# Patient Record
Sex: Female | Born: 1970 | Race: White | Hispanic: No | Marital: Married | Smoking: Never smoker
Health system: Southern US, Community
[De-identification: ages and names within clinical notes are randomized; demographics above are authoritative.]

## PROBLEM LIST (undated history)

## (undated) DIAGNOSIS — F329 Major depressive disorder, single episode, unspecified: Secondary | ICD-10-CM

## (undated) DIAGNOSIS — E042 Nontoxic multinodular goiter: Secondary | ICD-10-CM

## (undated) DIAGNOSIS — F419 Anxiety disorder, unspecified: Secondary | ICD-10-CM

## (undated) DIAGNOSIS — F32A Depression, unspecified: Secondary | ICD-10-CM

## (undated) DIAGNOSIS — A159 Respiratory tuberculosis unspecified: Secondary | ICD-10-CM

## (undated) HISTORY — PX: WISDOM TOOTH EXTRACTION: SHX21

## (undated) HISTORY — PX: INDUCED ABORTION: SHX677

---

## 1999-05-29 ENCOUNTER — Other Ambulatory Visit: Admission: RE | Admit: 1999-05-29 | Discharge: 1999-05-29 | Payer: Self-pay | Admitting: Obstetrics and Gynecology

## 1999-05-30 ENCOUNTER — Encounter (INDEPENDENT_AMBULATORY_CARE_PROVIDER_SITE_OTHER): Payer: Self-pay

## 1999-05-30 ENCOUNTER — Ambulatory Visit (HOSPITAL_COMMUNITY): Admission: RE | Admit: 1999-05-30 | Discharge: 1999-05-30 | Payer: Self-pay | Admitting: Obstetrics and Gynecology

## 2007-03-11 ENCOUNTER — Encounter: Admission: RE | Admit: 2007-03-11 | Discharge: 2007-03-11 | Payer: Self-pay | Admitting: Obstetrics and Gynecology

## 2008-05-11 ENCOUNTER — Encounter: Admission: RE | Admit: 2008-05-11 | Discharge: 2008-05-11 | Payer: Self-pay | Admitting: Family Medicine

## 2008-05-31 ENCOUNTER — Encounter: Admission: RE | Admit: 2008-05-31 | Discharge: 2008-05-31 | Payer: Self-pay | Admitting: Orthopedic Surgery

## 2010-06-14 NOTE — Op Note (Signed)
[facility]  Patient:    Rhonda Castillo, Rhonda Castillo                    MRN: [id number] Proc. Date: 05/30/99 Adm. Date:  [id number] Disc. Date: [id number] Attending:  Maxie Better                           Operative Report  PREOPERATIVE DIAGNOSIS:       Undesired pregnancy.  Intrauterine gestation at 7-3/7 weeks.  POSTOPERATIVE DIAGNOSIS:      Undesired pregnancy.  Intrauterine gestation at 7-3/7 weeks.  OPERATION:                    Suction dilatation and evacuation.  SURGEON:                      Sheronette A. Cherly Hensen, M.D.  ASSISTANT:  ANESTHESIA:                   MAC and paracervical block.  ESTIMATED BLOOD LOSS:  INDICATIONS:                  This is a 40 year old, gravida 1, para 0, female t 7-3/[redacted] weeks gestation with an undesired pregnancy who now presents for elective  termination.  Risks and benefits of the procedure had been explained.  At length, the consent was signed.  The patient was transferred to the operating room for er procedure.  DESCRIPTION OF PROCEDURE:     Under adequate monitored anesthesia, the patient as placed in the dorsal lithotomy position.  She was sterilely prepped and draped n the usual fashion.  Examination under anesthesia had revealed uterus that was about 7 to 8 weeks size, no adnexal masses could be appreciated.  The patients bladder was catheterized for a small amount of urine.  A bivalve speculum was placed in the vagina.  20 cc total of 1% Nesocaine was injected paracervically at 3 and 9 oclock.  The anterior lip of the cervix was grasped with a single tooth tenaculum. The cervix was then serially dilated up to #31 [facility] dilator and a #8 curved suction cannula was introduced into the uterine cavity.  The uterine cavity was  then suctioned of products of conception.  The suction cannula was removed. The uterine cavity was then curetted and resuctioned.  These procedures were then performed  until all tissue was felt to have been removed from the uterine cavity at which time all instruments were then removed from the vagina.  Specimen labled products of conception was sent to pathology.  Estimated blood loss was minimal. Complications were none.  The patient tolerated the procedure well and was transferred to the recovery room in stable condition.  Maternal blood type was pending and RhoGAM will be given as indicated. DD:  05/30/99 TD:  06/01/99 Job: [id number]

## 2010-07-16 ENCOUNTER — Other Ambulatory Visit: Payer: Self-pay | Admitting: Obstetrics and Gynecology

## 2010-07-16 DIAGNOSIS — Z1231 Encounter for screening mammogram for malignant neoplasm of breast: Secondary | ICD-10-CM

## 2010-07-24 ENCOUNTER — Ambulatory Visit
Admission: RE | Admit: 2010-07-24 | Discharge: 2010-07-24 | Disposition: A | Discharge status: Home / Self Care | Payer: BC Managed Care – PPO | Source: Ambulatory Visit | Attending: Obstetrics and Gynecology | Admitting: Obstetrics and Gynecology

## 2010-07-24 DIAGNOSIS — Z1231 Encounter for screening mammogram for malignant neoplasm of breast: Secondary | ICD-10-CM

## 2011-07-25 ENCOUNTER — Other Ambulatory Visit: Payer: Self-pay | Admitting: Obstetrics and Gynecology

## 2011-07-25 DIAGNOSIS — Z1231 Encounter for screening mammogram for malignant neoplasm of breast: Secondary | ICD-10-CM

## 2011-08-26 ENCOUNTER — Ambulatory Visit
Admission: RE | Admit: 2011-08-26 | Discharge: 2011-08-26 | Disposition: A | Discharge status: Home / Self Care | Payer: BC Managed Care – PPO | Source: Ambulatory Visit | Attending: Obstetrics and Gynecology | Admitting: Obstetrics and Gynecology

## 2011-08-26 DIAGNOSIS — Z1231 Encounter for screening mammogram for malignant neoplasm of breast: Secondary | ICD-10-CM

## 2011-08-29 ENCOUNTER — Other Ambulatory Visit: Payer: Self-pay | Admitting: Family Medicine

## 2011-08-29 DIAGNOSIS — R7989 Other specified abnormal findings of blood chemistry: Secondary | ICD-10-CM

## 2011-09-03 ENCOUNTER — Ambulatory Visit
Admission: RE | Admit: 2011-09-03 | Discharge: 2011-09-03 | Disposition: A | Discharge status: Home / Self Care | Payer: BC Managed Care – PPO | Source: Ambulatory Visit | Attending: Family Medicine | Admitting: Family Medicine

## 2011-09-03 DIAGNOSIS — R7989 Other specified abnormal findings of blood chemistry: Secondary | ICD-10-CM

## 2011-09-05 ENCOUNTER — Other Ambulatory Visit (HOSPITAL_COMMUNITY): Payer: Self-pay | Admitting: Family Medicine

## 2011-09-05 DIAGNOSIS — E0590 Thyrotoxicosis, unspecified without thyrotoxic crisis or storm: Secondary | ICD-10-CM

## 2011-09-10 ENCOUNTER — Ambulatory Visit (HOSPITAL_COMMUNITY): Payer: BC Managed Care – PPO

## 2011-09-11 ENCOUNTER — Other Ambulatory Visit (HOSPITAL_COMMUNITY): Payer: BC Managed Care – PPO

## 2011-09-15 ENCOUNTER — Encounter (HOSPITAL_COMMUNITY)
Admission: RE | Admit: 2011-09-15 | Discharge: 2011-09-15 | Disposition: A | Discharge status: Home / Self Care | Payer: BC Managed Care – PPO | Source: Ambulatory Visit | Attending: Family Medicine | Admitting: Family Medicine

## 2011-09-15 DIAGNOSIS — E0590 Thyrotoxicosis, unspecified without thyrotoxic crisis or storm: Secondary | ICD-10-CM | POA: Insufficient documentation

## 2011-09-16 ENCOUNTER — Encounter (HOSPITAL_COMMUNITY)
Admission: RE | Admit: 2011-09-16 | Discharge: 2011-09-16 | Disposition: A | Discharge status: Home / Self Care | Payer: BC Managed Care – PPO | Source: Ambulatory Visit | Attending: Family Medicine | Admitting: Family Medicine

## 2011-09-16 MED ORDER — SODIUM PERTECHNETATE TC 99M INJECTION
10.4000 | Freq: Once | INTRAVENOUS | Status: AC | PRN
  Administered 2011-09-16: 10.4 via INTRAVENOUS

## 2011-09-16 MED ORDER — SODIUM IODIDE I 131 CAPSULE: 15.5000 | Freq: Once | INTRAVENOUS | Status: AC | PRN

## 2011-09-24 ENCOUNTER — Other Ambulatory Visit: Payer: Self-pay | Admitting: Family Medicine

## 2011-09-24 DIAGNOSIS — E041 Nontoxic single thyroid nodule: Secondary | ICD-10-CM

## 2011-09-25 ENCOUNTER — Other Ambulatory Visit (HOSPITAL_COMMUNITY)
Admission: RE | Admit: 2011-09-25 | Discharge: 2011-09-25 | Disposition: A | Discharge status: Home / Self Care | Payer: BC Managed Care – PPO | Source: Ambulatory Visit | Attending: Interventional Radiology | Admitting: Interventional Radiology

## 2011-09-25 ENCOUNTER — Ambulatory Visit
Admission: RE | Admit: 2011-09-25 | Discharge: 2011-09-25 | Disposition: A | Discharge status: Home / Self Care | Payer: BC Managed Care – PPO | Source: Ambulatory Visit | Attending: Family Medicine | Admitting: Family Medicine

## 2011-09-25 DIAGNOSIS — E041 Nontoxic single thyroid nodule: Secondary | ICD-10-CM

## 2011-09-25 DIAGNOSIS — E049 Nontoxic goiter, unspecified: Secondary | ICD-10-CM | POA: Insufficient documentation

## 2012-07-13 ENCOUNTER — Other Ambulatory Visit: Payer: Self-pay

## 2012-07-13 DIAGNOSIS — Z1231 Encounter for screening mammogram for malignant neoplasm of breast: Secondary | ICD-10-CM

## 2012-08-26 ENCOUNTER — Ambulatory Visit
Admission: RE | Admit: 2012-08-26 | Discharge: 2012-08-26 | Disposition: A | Discharge status: Home / Self Care | Payer: BC Managed Care – PPO | Source: Ambulatory Visit

## 2012-08-26 DIAGNOSIS — Z1231 Encounter for screening mammogram for malignant neoplasm of breast: Secondary | ICD-10-CM

## 2013-08-29 ENCOUNTER — Other Ambulatory Visit: Payer: Self-pay

## 2013-08-29 DIAGNOSIS — Z1231 Encounter for screening mammogram for malignant neoplasm of breast: Secondary | ICD-10-CM

## 2013-09-01 ENCOUNTER — Encounter (INDEPENDENT_AMBULATORY_CARE_PROVIDER_SITE_OTHER): Payer: Self-pay

## 2013-09-01 ENCOUNTER — Ambulatory Visit
Admission: RE | Admit: 2013-09-01 | Discharge: 2013-09-01 | Disposition: A | Discharge status: Home / Self Care | Payer: BC Managed Care – PPO | Source: Ambulatory Visit

## 2013-09-01 DIAGNOSIS — Z1231 Encounter for screening mammogram for malignant neoplasm of breast: Secondary | ICD-10-CM

## 2013-10-14 ENCOUNTER — Other Ambulatory Visit (INDEPENDENT_AMBULATORY_CARE_PROVIDER_SITE_OTHER): Payer: Self-pay

## 2013-10-14 ENCOUNTER — Ambulatory Visit (INDEPENDENT_AMBULATORY_CARE_PROVIDER_SITE_OTHER): Payer: BC Managed Care – PPO | Admitting: General Surgery

## 2013-10-20 ENCOUNTER — Encounter: Payer: BC Managed Care – PPO | Attending: General Surgery | Admitting: Dietician

## 2013-10-20 VITALS — Ht 62.0 in | Wt 247.8 lb

## 2013-10-20 DIAGNOSIS — Z6841 Body Mass Index (BMI) 40.0 and over, adult: Secondary | ICD-10-CM | POA: Diagnosis not present

## 2013-10-20 DIAGNOSIS — Z01818 Encounter for other preprocedural examination: Secondary | ICD-10-CM | POA: Insufficient documentation

## 2013-10-20 DIAGNOSIS — Z713 Dietary counseling and surveillance: Secondary | ICD-10-CM | POA: Diagnosis present

## 2013-10-20 DIAGNOSIS — E669 Obesity, unspecified: Secondary | ICD-10-CM

## 2013-10-20 NOTE — Progress Notes (Signed)
  Pre-Op Assessment Visit:  Pre-Operative Gastric sleeve Surgery  Medical Nutrition Therapy:  Appt start time: 315   End time:  400.  Patient was seen on 10/20/2013 for Pre-Operative Gastric sleeve Nutrition Assessment. Assessment and letter of approval faxed to [facility] Bariatric Surgery Program coordinator on 10/20/2013.   Preferred Learning Style:   No preference indicated   Learning Readiness:   Ready  Handouts given during visit include:  Pre-Op Goals Bariatric Surgery Protein Shakes  Teaching Method Utilized:  Visual Auditory  Barriers to learning/adherence to lifestyle change: none  Demonstrated degree of understanding via:  Teach Back   Patient to call the [facility] to enroll in Pre-Op and Post-Op Nutrition Education when surgery date is scheduled.

## 2013-10-27 ENCOUNTER — Ambulatory Visit (HOSPITAL_COMMUNITY): Payer: BC Managed Care – PPO

## 2013-11-10 ENCOUNTER — Ambulatory Visit (HOSPITAL_COMMUNITY)
Admission: RE | Admit: 2013-11-10 | Discharge: 2013-11-10 | Disposition: A | Discharge status: Home / Self Care | Payer: BC Managed Care – PPO | Source: Ambulatory Visit | Attending: General Surgery | Admitting: General Surgery

## 2013-11-10 ENCOUNTER — Other Ambulatory Visit: Payer: Self-pay

## 2013-11-10 DIAGNOSIS — Z6841 Body Mass Index (BMI) 40.0 and over, adult: Secondary | ICD-10-CM | POA: Diagnosis not present

## 2013-11-10 DIAGNOSIS — Z01811 Encounter for preprocedural respiratory examination: Secondary | ICD-10-CM | POA: Diagnosis not present

## 2013-11-10 LAB — CBC WITH DIFFERENTIAL/PLATELET
BASOS ABS: 0 10*3/uL (ref 0.0–0.1)
BASOS PCT: 0 % (ref 0–1)
EOS ABS: 0.1 10*3/uL (ref 0.0–0.7)
EOS PCT: 2 % (ref 0–5)
HEMATOCRIT: 41.1 % (ref 36.0–46.0)
Hemoglobin: 13.8 g/dL (ref 12.0–15.0)
LYMPHS PCT: 35 % (ref 12–46)
Lymphs Abs: 2.2 10*3/uL (ref 0.7–4.0)
MCH: 29.7 pg (ref 26.0–34.0)
MCHC: 33.6 g/dL (ref 30.0–36.0)
MCV: 88.6 fL (ref 78.0–100.0)
MONO ABS: 0.4 10*3/uL (ref 0.1–1.0)
Monocytes Relative: 7 % (ref 3–12)
Neutro Abs: 3.5 10*3/uL (ref 1.7–7.7)
Neutrophils Relative %: 56 % (ref 43–77)
Platelets: 404 10*3/uL — ABNORMAL HIGH (ref 150–400)
RBC: 4.64 MIL/uL (ref 3.87–5.11)
RDW: 13.4 % (ref 11.5–15.5)
WBC: 6.2 10*3/uL (ref 4.0–10.5)

## 2013-11-10 LAB — COMPREHENSIVE METABOLIC PANEL
ALT: 12 U/L (ref 0–35)
AST: 16 U/L (ref 0–37)
Albumin: 3.8 g/dL (ref 3.5–5.2)
Alkaline Phosphatase: 65 U/L (ref 39–117)
BILIRUBIN TOTAL: 0.4 mg/dL (ref 0.2–1.2)
BUN: 11 mg/dL (ref 6–23)
CALCIUM: 9.2 mg/dL (ref 8.4–10.5)
CHLORIDE: 102 meq/L (ref 96–112)
CO2: 28 mEq/L (ref 19–32)
CREATININE: 0.74 mg/dL (ref 0.50–1.10)
Glucose, Bld: 83 mg/dL (ref 70–99)
Potassium: 4.4 mEq/L (ref 3.5–5.3)
Sodium: 137 mEq/L (ref 135–145)
Total Protein: 6.5 g/dL (ref 6.0–8.3)

## 2013-11-10 LAB — IRON AND TIBC
%SAT: 39 % (ref 20–55)
IRON: 110 ug/dL (ref 42–145)
TIBC: 283 ug/dL (ref 250–470)
UIBC: 173 ug/dL (ref 125–400)

## 2013-11-10 LAB — FOLATE: Folate: 10.3 ng/mL

## 2013-11-10 LAB — T4: T4, Total: 7.5 ug/dL (ref 4.5–12.0)

## 2013-11-10 LAB — VITAMIN B12: Vitamin B-12: 395 pg/mL (ref 211–911)

## 2013-11-10 LAB — LIPID PANEL
Cholesterol: 178 mg/dL (ref 0–200)
HDL: 69 mg/dL (ref >39)
LDL CALC: 91 mg/dL (ref 0–99)
Total CHOL/HDL Ratio: 2.6 Ratio
Triglycerides: 88 mg/dL (ref <150)
VLDL: 18 mg/dL (ref 0–40)

## 2013-11-10 LAB — TSH: TSH: 0.37 u[IU]/mL (ref 0.350–4.500)

## 2013-11-11 LAB — URINALYSIS
BILIRUBIN URINE: NEGATIVE
GLUCOSE, UA: NEGATIVE mg/dL
Hgb urine dipstick: NEGATIVE
Ketones, ur: NEGATIVE mg/dL
LEUKOCYTES UA: NEGATIVE
Nitrite: NEGATIVE
PH: 7.5 (ref 5.0–8.0)
PROTEIN: NEGATIVE mg/dL
SPECIFIC GRAVITY, URINE: 1.014 (ref 1.005–1.030)
Urobilinogen, UA: 0.2 mg/dL (ref 0.0–1.0)

## 2013-11-11 LAB — HCG, SERUM, QUALITATIVE: Preg, Serum: NEGATIVE

## 2013-11-15 ENCOUNTER — Ambulatory Visit (HOSPITAL_COMMUNITY): Payer: BC Managed Care – PPO

## 2014-01-09 ENCOUNTER — Other Ambulatory Visit (INDEPENDENT_AMBULATORY_CARE_PROVIDER_SITE_OTHER): Payer: Self-pay

## 2014-01-09 ENCOUNTER — Encounter: Payer: BC Managed Care – PPO | Attending: General Surgery

## 2014-01-09 VITALS — Ht 62.0 in | Wt 247.0 lb

## 2014-01-09 DIAGNOSIS — Z713 Dietary counseling and surveillance: Secondary | ICD-10-CM | POA: Diagnosis not present

## 2014-01-09 DIAGNOSIS — E669 Obesity, unspecified: Secondary | ICD-10-CM

## 2014-01-09 DIAGNOSIS — Z6841 Body Mass Index (BMI) 40.0 and over, adult: Secondary | ICD-10-CM | POA: Diagnosis not present

## 2014-01-10 NOTE — Progress Notes (Signed)
  Pre-Operative Nutrition Class:  Appt start time: 8550   End time:  1830.  Patient was seen on 01/09/14 for Pre-Operative Bariatric Surgery Education at the [facility].   Surgery date: 01/24/14 Surgery type: gastric sleeve Start weight at [facility]: 248 lbs on 10/20/13 Weight today: 247 lbs  TANITA  BODY COMP RESULTS  01/09/14   BMI (kg/m^2) 45.2   Fat Mass (lbs) 122.5   Fat Free Mass (lbs) 124.5   Total Body Water (lbs) 91   Samples given per MNT protocol. Patient educated on appropriate usage: Premier protein shake (chocolate - qty 1) Lot #: [id number] Exp: 09/2014    The following the learning objectives were met by the patient during this course:  Identify Pre-Op Dietary Goals and will begin 2 weeks pre-operatively  Identify appropriate sources of fluids and proteins   State protein recommendations and appropriate sources pre and post-operatively  Identify Post-Operative Dietary Goals and will follow for 2 weeks post-operatively  Identify appropriate multivitamin and calcium sources  Describe the need for physical activity post-operatively and will follow MD recommendations  State when to call healthcare provider regarding medication questions or post-operative complications  Handouts given during class include:  Pre-Op Bariatric Surgery Diet Handout  Protein Shake Handout  Post-Op Bariatric Surgery Nutrition Handout  BELT Program Information Flyer  Support Group Information Flyer  [facility] Bariatric Supplements Price List  Follow-Up Plan: Patient will follow-up at [facility] 2 weeks post operatively for diet advancement per MD.

## 2014-01-13 ENCOUNTER — Other Ambulatory Visit (INDEPENDENT_AMBULATORY_CARE_PROVIDER_SITE_OTHER): Payer: Self-pay | Admitting: General Surgery

## 2014-01-13 NOTE — Progress Notes (Signed)
Please put orders in Epic surgery 01-24-14 pre op 01-16-14 Thanks

## 2014-01-16 ENCOUNTER — Encounter (HOSPITAL_COMMUNITY): Payer: Self-pay

## 2014-01-16 ENCOUNTER — Encounter (HOSPITAL_COMMUNITY)
Admission: RE | Admit: 2014-01-16 | Discharge: 2014-01-16 | Disposition: A | Discharge status: Home / Self Care | Payer: BC Managed Care – PPO | Source: Ambulatory Visit | Attending: General Surgery | Admitting: General Surgery

## 2014-01-16 DIAGNOSIS — Z01812 Encounter for preprocedural laboratory examination: Secondary | ICD-10-CM | POA: Diagnosis present

## 2014-01-16 HISTORY — DX: Anxiety disorder, unspecified: F41.9

## 2014-01-16 HISTORY — DX: Major depressive disorder, single episode, unspecified: F32.9

## 2014-01-16 HISTORY — DX: Respiratory tuberculosis unspecified: A15.9

## 2014-01-16 HISTORY — DX: Nontoxic multinodular goiter: E04.2

## 2014-01-16 HISTORY — DX: Depression, unspecified: F32.A

## 2014-01-16 LAB — COMPREHENSIVE METABOLIC PANEL
ALBUMIN: 3.3 g/dL — AB (ref 3.5–5.2)
ALT: 21 U/L (ref 0–35)
AST: 22 U/L (ref 0–37)
Alkaline Phosphatase: 69 U/L (ref 39–117)
Anion gap: 11 (ref 5–15)
BUN: 16 mg/dL (ref 6–23)
CALCIUM: 9.2 mg/dL (ref 8.4–10.5)
CHLORIDE: 102 meq/L (ref 96–112)
CO2: 27 meq/L (ref 19–32)
CREATININE: 0.65 mg/dL (ref 0.50–1.10)
GFR calc Af Amer: >90 mL/min (ref >90)
GFR calc non Af Amer: >90 mL/min (ref >90)
Glucose, Bld: 90 mg/dL (ref 70–99)
Potassium: 4.9 mEq/L (ref 3.7–5.3)
Sodium: 140 mEq/L (ref 137–147)
Total Bilirubin: <0.2 mg/dL — ABNORMAL LOW (ref 0.3–1.2)
Total Protein: 7 g/dL (ref 6.0–8.3)

## 2014-01-16 LAB — CBC WITH DIFFERENTIAL/PLATELET
BASOS ABS: 0 10*3/uL (ref 0.0–0.1)
BASOS PCT: 0 % (ref 0–1)
Eosinophils Absolute: 0.3 10*3/uL (ref 0.0–0.7)
Eosinophils Relative: 4 % (ref 0–5)
HCT: 42.1 % (ref 36.0–46.0)
Hemoglobin: 13.6 g/dL (ref 12.0–15.0)
LYMPHS PCT: 33 % (ref 12–46)
Lymphs Abs: 2.2 10*3/uL (ref 0.7–4.0)
MCH: 30 pg (ref 26.0–34.0)
MCHC: 32.3 g/dL (ref 30.0–36.0)
MCV: 92.7 fL (ref 78.0–100.0)
MONO ABS: 0.4 10*3/uL (ref 0.1–1.0)
Monocytes Relative: 7 % (ref 3–12)
NEUTROS ABS: 3.7 10*3/uL (ref 1.7–7.7)
Neutrophils Relative %: 56 % (ref 43–77)
PLATELETS: 366 10*3/uL (ref 150–400)
RBC: 4.54 MIL/uL (ref 3.87–5.11)
RDW: 12.7 % (ref 11.5–15.5)
WBC: 6.6 10*3/uL (ref 4.0–10.5)

## 2014-01-16 NOTE — Progress Notes (Signed)
11/10/2013 EKG per epic 11/10/2013 CXR per epic

## 2014-01-16 NOTE — Patient Instructions (Signed)
Raymond GurneyKimberly R Mathia  01/16/2014   Your procedure is scheduled on: Tuesday January 24, 2014  [facility] and follow signs to               Short Stay Center arrive at 0700 AM.  Call this number if you have problems the morning of surgery [phone]   Remember:  Do not eat food or drink liquids :After Midnight.     Take these medicines the morning of surgery with A SIP OF WATER: Fluoxetine                               You may not have any metal on your body including hair pins and              piercings  Do not wear jewelry, make-up, lotions, powders or perfumes.             Do not wear nail polish.  Do not shave  48 hours prior to surgery.               Do not bring valuables to the hospital. [place] IS NOT             RESPONSIBLE   FOR VALUABLES.  Contacts, dentures or bridgework may not be worn into surgery.  Leave suitcase in the car. After surgery it may be brought to your room.  _____________________________________________________________________             [facility] - Pre[facility] Before surgery, you can play an important role.  Because skin is not sterile, your skin needs to be as free of germs as possible.  You can reduce the number of germs on your skin by washing with CHG (chlorahexidine gluconate) soap before surgery.  CHG is an antiseptic cleaner which kills germs and bonds with the skin to continue killing germs even after washing. Please DO NOT use if you have an allergy to CHG or antibacterial soaps.  If your skin becomes reddened/irritated stop using the CHG and inform your nurse when you arrive at Short Stay. Do not shave (including legs and underarms) for at least 48 hours prior to the first CHG shower.  You may shave your face/neck. Please follow these instructions carefully:  1.  Shower with CHG Soap the night before surgery and the  morning of Surgery.  2.  If you choose to wash your hair, wash your  hair first as usual with your  normal  shampoo.  3.  After you shampoo, rinse your hair and body thoroughly to remove the  shampoo.                           4.  Use CHG as you would any other liquid soap.  You can apply chg directly  to the skin and wash                       Gently with a scrungie or clean washcloth.  5.  Apply the CHG Soap to your body ONLY FROM THE NECK DOWN.   Do not use on face/ open                           Wound  or open sores. Avoid contact with eyes, ears mouth and genitals (private parts).                       Wash face,  Genitals (private parts) with your normal soap.             6.  Wash thoroughly, paying special attention to the area where your surgery  will be performed.  7.  Thoroughly rinse your body with warm water from the neck down.  8.  DO NOT shower/wash with your normal soap after using and rinsing off  the CHG Soap.                9.  Pat yourself dry with a clean towel.            10.  Wear clean pajamas.            11.  Place clean sheets on your bed the night of your first shower and do not  sleep with pets. Day of Surgery : Do not apply any lotions/deodorants the morning of surgery.  Please wear clean clothes to the hospital/surgery center.  FAILURE TO FOLLOW THESE INSTRUCTIONS MAY RESULT IN THE CANCELLATION OF YOUR SURGERY PATIENT SIGNATURE_________________________________  NURSE SIGNATURE__________________________________

## 2014-01-24 ENCOUNTER — Encounter (HOSPITAL_COMMUNITY)
Admission: RE | Disposition: A | Discharge status: Home / Self Care | Payer: Self-pay | Source: Ambulatory Visit | Attending: General Surgery

## 2014-01-24 ENCOUNTER — Inpatient Hospital Stay (HOSPITAL_COMMUNITY)
Admission: RE | Admit: 2014-01-24 | Discharge: 2014-01-26 | DRG: 621 | Disposition: A | Discharge status: Home / Self Care | Payer: BC Managed Care – PPO | Source: Ambulatory Visit | Attending: General Surgery | Admitting: General Surgery

## 2014-01-24 ENCOUNTER — Inpatient Hospital Stay (HOSPITAL_COMMUNITY): Payer: BC Managed Care – PPO | Admitting: Registered Nurse

## 2014-01-24 ENCOUNTER — Encounter (HOSPITAL_COMMUNITY): Payer: Self-pay | Admitting: *Deleted

## 2014-01-24 DIAGNOSIS — Z6841 Body Mass Index (BMI) 40.0 and over, adult: Secondary | ICD-10-CM

## 2014-01-24 DIAGNOSIS — Z9884 Bariatric surgery status: Secondary | ICD-10-CM

## 2014-01-24 DIAGNOSIS — Z01812 Encounter for preprocedural laboratory examination: Secondary | ICD-10-CM

## 2014-01-24 HISTORY — PX: LAPAROSCOPIC GASTRIC SLEEVE RESECTION: SHX5895

## 2014-01-24 LAB — PREGNANCY, URINE: PREG TEST UR: NEGATIVE

## 2014-01-24 LAB — HEMOGLOBIN AND HEMATOCRIT, BLOOD
HEMATOCRIT: 40 % (ref 36.0–46.0)
Hemoglobin: 13.1 g/dL (ref 12.0–15.0)

## 2014-01-24 SURGERY — GASTRECTOMY, SLEEVE, LAPAROSCOPIC
Anesthesia: General | Site: Abdomen

## 2014-01-24 MED ORDER — PROMETHAZINE HCL 25 MG/ML IJ SOLN: 6.2500 mg | INTRAMUSCULAR | Status: DC | PRN

## 2014-01-24 MED ORDER — DEXAMETHASONE SODIUM PHOSPHATE 10 MG/ML IJ SOLN
INTRAMUSCULAR | Status: AC
  Filled 2014-01-24: qty 1

## 2014-01-24 MED ORDER — LACTATED RINGERS IR SOLN: Status: DC | PRN

## 2014-01-24 MED ORDER — MIDAZOLAM HCL 5 MG/5ML IJ SOLN
INTRAMUSCULAR | Status: DC | PRN
  Administered 2014-01-24: 2 mg via INTRAVENOUS

## 2014-01-24 MED ORDER — HYDROMORPHONE HCL 1 MG/ML IJ SOLN
INTRAMUSCULAR | Status: DC | PRN
  Administered 2014-01-24 (×2): 1 mg via INTRAVENOUS

## 2014-01-24 MED ORDER — UNJURY CHOCOLATE CLASSIC POWDER: 2.0000 [oz_av] | Freq: Four times a day (QID) | ORAL | Status: DC

## 2014-01-24 MED ORDER — NEOSTIGMINE METHYLSULFATE 10 MG/10ML IV SOLN
INTRAVENOUS | Status: DC | PRN
  Administered 2014-01-24: 5 mg via INTRAVENOUS

## 2014-01-24 MED ORDER — PHENYLEPHRINE HCL 10 MG/ML IJ SOLN
INTRAMUSCULAR | Status: DC | PRN
Start: 2014-01-24 — End: 2014-01-24
  Administered 2014-01-24 (×2): 80 ug via INTRAVENOUS

## 2014-01-24 MED ORDER — SODIUM CHLORIDE 0.9 % IJ SOLN
INTRAMUSCULAR | Status: AC
  Filled 2014-01-24: qty 10

## 2014-01-24 MED ORDER — ONDANSETRON HCL 4 MG/2ML IJ SOLN
4.0000 mg | INTRAMUSCULAR | Status: DC | PRN
  Administered 2014-01-24 – 2014-01-25 (×2): 4 mg via INTRAVENOUS
  Filled 2014-01-24 (×2): qty 2

## 2014-01-24 MED ORDER — BUPIVACAINE-EPINEPHRINE 0.25% -1:200000 IJ SOLN
INTRAMUSCULAR | Status: DC | PRN
  Administered 2014-01-24: 50 mL

## 2014-01-24 MED ORDER — UNJURY CHICKEN SOUP POWDER: 2.0000 [oz_av] | Freq: Four times a day (QID) | ORAL | Status: DC

## 2014-01-24 MED ORDER — ACETAMINOPHEN 160 MG/5ML PO SOLN: 650.0000 mg | ORAL | Status: DC | PRN

## 2014-01-24 MED ORDER — GLYCOPYRROLATE 0.2 MG/ML IJ SOLN
INTRAMUSCULAR | Status: AC
  Filled 2014-01-24: qty 4

## 2014-01-24 MED ORDER — ONDANSETRON HCL 4 MG/2ML IJ SOLN
INTRAMUSCULAR | Status: DC | PRN
  Administered 2014-01-24: 4 mg via INTRAVENOUS

## 2014-01-24 MED ORDER — SUFENTANIL CITRATE 50 MCG/ML IV SOLN
INTRAVENOUS | Status: AC
  Filled 2014-01-24: qty 1

## 2014-01-24 MED ORDER — SUFENTANIL CITRATE 50 MCG/ML IV SOLN
INTRAVENOUS | Status: DC | PRN
  Administered 2014-01-24 (×2): 10 ug via INTRAVENOUS
  Administered 2014-01-24 (×2): 5 ug via INTRAVENOUS
  Administered 2014-01-24 (×3): 10 ug via INTRAVENOUS

## 2014-01-24 MED ORDER — TISSEEL VH 10 ML EX KIT
PACK | CUTANEOUS | Status: DC | PRN
  Administered 2014-01-24: 10 mL

## 2014-01-24 MED ORDER — HEPARIN SODIUM (PORCINE) 5000 UNIT/ML IJ SOLN
5000.0000 [IU] | INTRAMUSCULAR | Status: AC
  Administered 2014-01-24: 5000 [IU] via SUBCUTANEOUS
  Filled 2014-01-24: qty 1

## 2014-01-24 MED ORDER — DEXTROSE 5 % IV SOLN
2.0000 g | INTRAVENOUS | Status: AC
  Administered 2014-01-24: 2 g via INTRAVENOUS

## 2014-01-24 MED ORDER — LIDOCAINE HCL (CARDIAC) 20 MG/ML IV SOLN
INTRAVENOUS | Status: DC | PRN
  Administered 2014-01-24: 75 mg via INTRAVENOUS
  Administered 2014-01-24: 25 mg via INTRAVENOUS

## 2014-01-24 MED ORDER — DEXAMETHASONE SODIUM PHOSPHATE 10 MG/ML IJ SOLN
INTRAMUSCULAR | Status: DC | PRN
  Administered 2014-01-24: 10 mg via INTRAVENOUS

## 2014-01-24 MED ORDER — ROCURONIUM BROMIDE 100 MG/10ML IV SOLN
INTRAVENOUS | Status: DC | PRN
  Administered 2014-01-24: 10 mg via INTRAVENOUS
  Administered 2014-01-24: 5 mg via INTRAVENOUS
  Administered 2014-01-24: 45 mg via INTRAVENOUS

## 2014-01-24 MED ORDER — ACETAMINOPHEN 10 MG/ML IV SOLN
INTRAVENOUS | Status: DC | PRN
  Administered 2014-01-24: 1000 mg via INTRAVENOUS

## 2014-01-24 MED ORDER — SODIUM CHLORIDE 0.9 % IR SOLN
Status: DC | PRN
  Administered 2014-01-24: 1000 mL

## 2014-01-24 MED ORDER — UNJURY VANILLA POWDER: 2.0000 [oz_av] | Freq: Four times a day (QID) | ORAL | Status: DC

## 2014-01-24 MED ORDER — LACTATED RINGERS IV SOLN
INTRAVENOUS | Status: DC
  Administered 2014-01-24: 1000 mL via INTRAVENOUS

## 2014-01-24 MED ORDER — FENTANYL CITRATE 0.05 MG/ML IJ SOLN
INTRAMUSCULAR | Status: AC
  Filled 2014-01-24: qty 2

## 2014-01-24 MED ORDER — ACETAMINOPHEN 160 MG/5ML PO SOLN: 325.0000 mg | ORAL | Status: DC | PRN

## 2014-01-24 MED ORDER — BUPIVACAINE-EPINEPHRINE 0.25% -1:200000 IJ SOLN
INTRAMUSCULAR | Status: AC
  Filled 2014-01-24: qty 1

## 2014-01-24 MED ORDER — ACETAMINOPHEN 10 MG/ML IV SOLN
1000.0000 mg | Freq: Once | INTRAVENOUS | Status: AC
  Filled 2014-01-24: qty 100

## 2014-01-24 MED ORDER — SUCCINYLCHOLINE CHLORIDE 20 MG/ML IJ SOLN
INTRAMUSCULAR | Status: DC | PRN
  Administered 2014-01-24: 100 mg via INTRAVENOUS

## 2014-01-24 MED ORDER — LIDOCAINE HCL (CARDIAC) 20 MG/ML IV SOLN
INTRAVENOUS | Status: AC
  Filled 2014-01-24: qty 5

## 2014-01-24 MED ORDER — HEPARIN SODIUM (PORCINE) 5000 UNIT/ML IJ SOLN
5000.0000 [IU] | Freq: Three times a day (TID) | INTRAMUSCULAR | Status: DC
  Administered 2014-01-24 – 2014-01-26 (×5): 5000 [IU] via SUBCUTANEOUS
  Filled 2014-01-24 (×8): qty 1

## 2014-01-24 MED ORDER — ROCURONIUM BROMIDE 100 MG/10ML IV SOLN
INTRAVENOUS | Status: AC
  Filled 2014-01-24: qty 1

## 2014-01-24 MED ORDER — PROPOFOL 10 MG/ML IV BOLUS
INTRAVENOUS | Status: AC
  Filled 2014-01-24: qty 20

## 2014-01-24 MED ORDER — MEPERIDINE HCL 50 MG/ML IJ SOLN: 6.2500 mg | INTRAMUSCULAR | Status: DC | PRN

## 2014-01-24 MED ORDER — OXYCODONE HCL 5 MG/5ML PO SOLN
5.0000 mg | ORAL | Status: DC | PRN
  Administered 2014-01-25 (×2): 5 mg via ORAL
  Filled 2014-01-24 (×2): qty 5

## 2014-01-24 MED ORDER — KCL IN DEXTROSE-NACL 20-5-0.9 MEQ/L-%-% IV SOLN
INTRAVENOUS | Status: DC
  Administered 2014-01-24 – 2014-01-26 (×6): via INTRAVENOUS
  Filled 2014-01-24 (×8): qty 1000

## 2014-01-24 MED ORDER — LACTATED RINGERS IV SOLN: INTRAVENOUS | Status: DC

## 2014-01-24 MED ORDER — MIDAZOLAM HCL 2 MG/2ML IJ SOLN
INTRAMUSCULAR | Status: AC
  Filled 2014-01-24: qty 2

## 2014-01-24 MED ORDER — FENTANYL CITRATE 0.05 MG/ML IJ SOLN
25.0000 ug | INTRAMUSCULAR | Status: DC | PRN
  Administered 2014-01-24 (×2): 50 ug via INTRAVENOUS

## 2014-01-24 MED ORDER — GLYCOPYRROLATE 0.2 MG/ML IJ SOLN
INTRAMUSCULAR | Status: DC | PRN
  Administered 2014-01-24: .8 mg via INTRAVENOUS

## 2014-01-24 MED ORDER — PROPOFOL 10 MG/ML IV BOLUS
INTRAVENOUS | Status: AC
  Filled 2014-01-24: qty 40

## 2014-01-24 MED ORDER — NEOSTIGMINE METHYLSULFATE 10 MG/10ML IV SOLN
INTRAVENOUS | Status: AC
  Filled 2014-01-24: qty 1

## 2014-01-24 MED ORDER — GLYCOPYRROLATE 0.2 MG/ML IJ SOLN
INTRAMUSCULAR | Status: AC
  Filled 2014-01-24: qty 1

## 2014-01-24 MED ORDER — HYDROMORPHONE HCL 2 MG/ML IJ SOLN
INTRAMUSCULAR | Status: AC
  Filled 2014-01-24: qty 1

## 2014-01-24 MED ORDER — ONDANSETRON HCL 4 MG/2ML IJ SOLN
INTRAMUSCULAR | Status: AC
  Filled 2014-01-24: qty 2

## 2014-01-24 MED ORDER — TISSEEL VH 10 ML EX KIT
PACK | CUTANEOUS | Status: AC
  Filled 2014-01-24: qty 1

## 2014-01-24 MED ORDER — DEXTROSE 5 % IV SOLN
INTRAVENOUS | Status: AC
  Filled 2014-01-24: qty 2

## 2014-01-24 MED ORDER — MORPHINE SULFATE 2 MG/ML IJ SOLN
2.0000 mg | INTRAMUSCULAR | Status: DC | PRN
  Administered 2014-01-24 – 2014-01-25 (×6): 2 mg via INTRAVENOUS
  Filled 2014-01-24 (×2): qty 1
  Filled 2014-01-24: qty 2
  Filled 2014-01-24: qty 1
  Filled 2014-01-24: qty 2

## 2014-01-24 MED ORDER — PROPOFOL 10 MG/ML IV BOLUS
INTRAVENOUS | Status: DC | PRN
  Administered 2014-01-24: 200 mg via INTRAVENOUS

## 2014-01-24 SURGICAL SUPPLY — 55 items
APL SRG 32X5 SNPLK LF DISP (MISCELLANEOUS) ×1
APPLICATOR COTTON TIP 6IN STRL (MISCELLANEOUS) ×9 IMPLANT
APPLIER CLIP ROT 10 11.4 M/L (STAPLE)
APPLIER CLIP ROT 13.4 12 LRG (CLIP)
APR CLP LRG 13.4X12 ROT 20 MLT (CLIP)
BAG SPEC RTRVL LRG 6X4 10 (ENDOMECHANICALS)
BLADE SURG SZ11 CARB STEEL (BLADE) ×3 IMPLANT
CABLE HIGH FREQUENCY MONO STRZ (ELECTRODE) ×3 IMPLANT
CHLORAPREP W/TINT 26ML (MISCELLANEOUS) ×3 IMPLANT
CLIP APPLIE ROT 10 11.4 M/L (STAPLE) IMPLANT
CLIP APPLIE ROT 13.4 12 LRG (CLIP) IMPLANT
DEVICE SUTURE ENDOST 10MM (ENDOMECHANICALS) IMPLANT
DEVICE TROCAR PUNCTURE CLOSURE (ENDOMECHANICALS) ×3 IMPLANT
DRAPE CAMERA CLOSED 9X96 (DRAPES) ×3 IMPLANT
DRAPE UTILITY XL STRL (DRAPES) ×6 IMPLANT
ELECT REM PT RETURN 9FT ADLT (ELECTROSURGICAL) ×3
ELECTRODE REM PT RTRN 9FT ADLT (ELECTROSURGICAL) ×1 IMPLANT
GAUZE SPONGE 4X4 12PLY STRL (GAUZE/BANDAGES/DRESSINGS) IMPLANT
GLOVE BIOGEL PI IND STRL 7.5 (GLOVE) ×4 IMPLANT
GLOVE BIOGEL PI INDICATOR 7.5 (GLOVE) ×8
GLOVE SS BIOGEL STRL SZ 7.5 (GLOVE) ×4 IMPLANT
GLOVE SUPERSENSE BIOGEL SZ 7.5 (GLOVE) ×8
GOWN STRL REUS W/TWL XL LVL3 (GOWN DISPOSABLE) ×12 IMPLANT
HOVERMATT SINGLE USE (MISCELLANEOUS) IMPLANT
KIT BASIN OR (CUSTOM PROCEDURE TRAY) ×3 IMPLANT
LIQUID BAND (GAUZE/BANDAGES/DRESSINGS) ×3 IMPLANT
NEEDLE SPNL 22GX3.5 QUINCKE BK (NEEDLE) ×3 IMPLANT
PACK UNIVERSAL I (CUSTOM PROCEDURE TRAY) ×3 IMPLANT
PEN SKIN MARKING BROAD (MISCELLANEOUS) ×3 IMPLANT
POUCH SPECIMEN RETRIEVAL 10MM (ENDOMECHANICALS) IMPLANT
RELOAD STAPLER BLUE 60MM (STAPLE) ×5 IMPLANT
RELOAD STAPLER GOLD 60MM (STAPLE) ×1 IMPLANT
RELOAD STAPLER GREEN 60MM (STAPLE) IMPLANT
SCISSORS LAP 5X35 DISP (ENDOMECHANICALS) ×3 IMPLANT
SEALANT SURGICAL APPL DUAL CAN (MISCELLANEOUS) ×3 IMPLANT
SET IRRIG TUBING LAPAROSCOPIC (IRRIGATION / IRRIGATOR) ×3 IMPLANT
SHEARS CURVED HARMONIC AC 45CM (MISCELLANEOUS) ×3 IMPLANT
SLEEVE ADV FIXATION 5X100MM (TROCAR) ×3 IMPLANT
SLEEVE GASTRECTOMY 36FR VISIGI (MISCELLANEOUS) ×3 IMPLANT
SOLUTION ANTI FOG 6CC (MISCELLANEOUS) ×3 IMPLANT
SPONGE LAP 18X18 X RAY DECT (DISPOSABLE) ×3 IMPLANT
STAPLER ECHELON LONG 60 440 (INSTRUMENTS) ×3 IMPLANT
STAPLER RELOAD BLUE 60MM (STAPLE) ×15
STAPLER RELOAD GOLD 60MM (STAPLE) ×3
STAPLER RELOAD GREEN 60MM (STAPLE)
SUT ETHILON 2 0 PS N (SUTURE) IMPLANT
SUT MNCRL AB 4-0 PS2 18 (SUTURE) ×3 IMPLANT
SYR 20CC LL (SYRINGE) ×3 IMPLANT
TOWEL OR NON WOVEN STRL DISP B (DISPOSABLE) ×3 IMPLANT
TROCAR ADV FIXATION 5X100MM (TROCAR) ×3 IMPLANT
TROCAR BLADELESS 15MM (ENDOMECHANICALS) ×3 IMPLANT
TUBING CONNECTING 10 (TUBING) ×2 IMPLANT
TUBING CONNECTING 10' (TUBING) ×1
TUBING ENDO SMARTCAP PENTAX (MISCELLANEOUS) ×3 IMPLANT
TUBING FILTER THERMOFLATOR (ELECTROSURGICAL) ×3 IMPLANT

## 2014-01-24 NOTE — H&P (Signed)
  History of Present Illness Rhonda Castillo(Rhonda Castillo T. Rhonda Duffett MD; 01/05/2014 10:40 AM) Patient words: pre op briatric sleeve.  The patient is a 43 year old female who presents for a bariatric surgery evaluation. She returns to the office for a preoperative visit prior to planned laparoscopic sleeve gastrectomy for morbid obesity with a BMI presentation of 44 and comorbidities of joint pain. She has successfully completed her preoperative workup. We reviewed dietary and nutrition consult and there were no concerns. Upper GI series was normal. Lab work was unremarkable. She has generally been feeling well with no new illness since her initial evaluation.   Vitals Rhonda Castillo(Rhonda Castillo CMA; 01/05/2014 10:14 AM) 01/05/2014 10:12 AM Weight: 247.5 lb Height: 63in Body Surface Area: 2.23 m Body Mass Index: 43.84 kg/m Temp.: 98.86F  Pulse: 80 (Regular)  BP: 122/80 (Sitting, Left Arm, Standard)    Physical Exam Rhonda Castillo(Rhonda Castillo T. Cleveland Paiz MD; 01/05/2014 10:41 AM) The physical exam findings are as follows: Note:General: Alert, obese Caucasian female, in no distress Skin: Warm and dry without rash or infection. HEENT: No palpable masses or thyromegaly. Sclera nonicteric. Pupils equal round and reactive. Oropharynx clear. Lymph nodes: No cervical, supraclavicular, or inguinal nodes palpable. Lungs: Breath sounds clear and equal. No wheezing or increased work of breathing. Cardiovascular: Regular rate and rhythm without murmer. No JVD or edema. Peripheral pulses intact. No carotid bruits. Abdomen: Nondistended. Soft and nontender. No masses palpable. No organomegaly. No palpable hernias. Extremities: No edema or joint swelling or deformity. No chronic venous stasis changes. Neurologic: Alert and fully oriented. Gait normal. No focal weakness. Psychiatric: Normal mood and affect. Thought content appropriate with normal judgement and insight    Assessment & Plan Rhonda Castillo(Rhonda Castillo T. Rhonda Mosher MD; 01/05/2014  10:42 AM) MORBID OBESITY (278.01  E66.01) Impression: Patient with progressive morbid obesity unresponsive to multiple efforts at medical management who presents with a BMI o 44 and comorbidities of joint pain. Ready to proceed with planned laparoscopic sleeve gastrectomy. We reviewed the consent form and all her questions were answered. She is starting her preoperative diet. She is given a prescription for pain medication. Current Plans  Started OxyCODONE HCl 5MG /5ML, 5-10 Milliliter every four hours, as needed, 200 Milliliter, 01/05/2014, No Refill.

## 2014-01-24 NOTE — Op Note (Signed)
Preoperative diagnosis: Morbid obesity  Postoperative diagnosis: Same  Surgical procedure: Laparoscopic sleeve gastrectomy    Surgeon: Glenna FellowsHoxworth, Bryelle Spiewak T   Assistants: Ovidio Kinavid Newman  Anesthesia:  General endotracheal anesthesia  Indications: Patient is a 43 year old female with progressive morbid obesity unresponsive to medical management who presents at a BMI of 44. After extensive preoperative workup and discussion detailed elsewhere with electric proceed with laparoscopic sleeve gastrectomy for surgical treatment of her obesity.    Procedure Detail:  Patient was brought to the operating room, placed in the supine position on the operating table, and general endotracheal anesthesia induced. PAS were in place. She received preoperative IV antibiotics and subcutaneous heparin. The abdomen was widely sterilely prepped and draped. Patient timeout was performed and correct procedure verified. Access was obtained in the left upper quadrant midclavicular line with a 5 mm Optiview trocar without difficulty and pneumoperitoneum established. There was no evidence of trocar injury. Under direct vision. 5 mm trocar was placed laterally in the right upper quadrant, a 15 mm trocar in the right upper quadrant toward the midline at the base of the falciform ligament and a 5 mm trocar just above and to the left of the umbilicus for the camera port. Under direct vision a 5 mm Nathanson retractor was placed subxiphoid and left lobe of the liver elevated with excellent exposure of the stomach and hiatus. There was no evidence of hiatal hernia. Her preoperative upper GI series was normal. Measuring 5 cm from the pylorus the greater curve was dissected with the Harmonic scalpel and the lesser sac was entered. The omentum was dissected off of the greater curve working up toward the fundus with harmonic scalpel. Short gastric vessels were individually divided. The fundus was mobilized away from the spleen and the  left crus was fully dissected to the hiatus and the fundus fully mobilized. There were a few avascular posterior attachments that were divided until the stomach was completely freed along its lesser curve vasculature. We again measured 5 cm from the pylorus and dissected a bit more omentum here in preparation for the first firing. The VisiG gastric tube was introduced orally into the stomach and passed distally to the pylorus. It was positioned along the lesser curve and then suction applied with good symmetric fixation of the lesser curve.An initial firing of the gold load echelon 60 mm stapler was performed at the greater curve angling up toward but staying away from the incisura. A second firing of the blue load 60 mm echelon stapler was then fired being careful to  Leave a little extra room adjacent to the incisura. Following this several further firings of the blue load 60 mm echelon stapler were performed along the side of the VisiG up toward the angle of Hiss with the final firing going just lateral to the fat pad completing the sleeve. The staple line appeared very symmetrical without scalloping and no bleeding. The sleeve was insufflated and under saline irrigation was no evidence of leak. The VisiG G-tube was removed. Dr. Ezzard StandingNewman performed Upper endoscopy showing a very symmetrical sleeve with no evidence of bleeding or leak under saline irrigation and insufflation. The abdomen was irrigated and complete hemostasis assured. Tisseel was used to cut the staple line. The gastric specimen was brought up to the 15 mm trocar site which was dilated slightly and the specimen removed without difficulty. The fascia at the site was closed with a single 0 Vicryl suture placed via the Endo Close. The Nathanson retractor was removed  under direct vision all CO2 evacuated and trochars removed. Skin incisions were closed with subcuticular Monocryl and Dermabond sponge needle and survey counts were  correct.    Findings: As above  Estimated Blood Loss:  Minimal         Drains: none  Blood Given: none          Specimens: Greater curvature of stomach        Complications:  * No complications entered in OR log *         Disposition: PACU - hemodynamically stable.         Condition: stable

## 2014-01-24 NOTE — Progress Notes (Signed)
Patient alert and oriented, op day.  Provided support and encouragement.  Encouraged pulmonary toilet and ambulation.  Discussed plan of care and timeline of events for POD 1.  All questions answered.  Will continue to monitor.

## 2014-01-24 NOTE — Interval H&P Note (Signed)
History and Physical Interval Note:  01/24/2014 9:04 AM  Rhonda Castillo  has presented today for surgery, with the diagnosis of Morbid Obesity  The various methods of treatment have been discussed with the patient and family. After consideration of risks, benefits and other options for treatment, the patient has consented to  Procedure(s): LAPAROSCOPIC GASTRIC SLEEVE RESECTION (N/A) as a surgical intervention .  The patient's history has been reviewed, patient examined, no change in status, stable for surgery.  I have reviewed the patient's chart and labs.  Questions were answered to the patient's satisfaction.     [name] T

## 2014-01-24 NOTE — Op Note (Signed)
Name:  Raymond GurneyKimberly R Lau MRN: [id number] Date of Surgery: 01/24/2014  Preop Diagnosis:  Morbid Obesity  Postop Diagnosis:  Morbid Obesity (Weight - 247, BMI 434.84), S/P Gastric Sleeve  Procedure:  Upper endoscopy  (Intraoperative)  Surgeon:  Ovidio Kinavid Kort Stettler, M.D.  Anesthesia:  GET  Indications for procedure: Raymond GurneyKimberly R Bialecki is a 43 y.o. female whose primary care physician is Darrow BussingKOIRALA,DIBAS, MD and has completed a Gastric Sleeve today by Dr. Johna SheriffHoxworth.  I am doing an intraoperative upper endoscopy to evaluate the gastric pouch.  Operative Note: The patient is under general anesthesia.  Dr. Johna SheriffHoxworth is laparoscoping the patient while I do an upper endoscopy to evaluate the stomach pouch.  With the patient intubated, I passed the Pentax upper endoscope without difficulty down the esophagus.  The esophago-gastric junction was at 39 cm.    The mucosa of the stomach looked viable and the staple line was intact without bleeding.  I advanced to the pylorus, but did not go through it.  While I insufflated the stomach pouch with air, Dr. Johna SheriffHoxworth  flooded the upper abdomen with saline to put the gastric pouch under saline.  There was no bubbling or evidence of a leak.  Photos were taken of the gastric pouch.  There was no evidence of narrowing of the pouch and the gastric sleeve looked tubular.  The scope was then withdrawn.  The esophagus was unremarkable and the patient tolerated the endoscopy without difficulty.  Ovidio Kinavid Lorenza Winkleman, MD, [facility] Pager: [phone] Office phone:  [phone]

## 2014-01-24 NOTE — Transfer of Care (Signed)
Immediate Anesthesia Transfer of Care Note  Patient: Raymond GurneyKimberly R Brubeck  Procedure(s) Performed: Procedure(s): LAPAROSCOPIC GASTRIC SLEEVE RESECTION (N/A)  Patient Location: PACU  Anesthesia Type:General  Level of Consciousness: awake, alert , oriented and patient cooperative  Airway & Oxygen Therapy: Patient Spontanous Breathing and Patient connected to face mask oxygen  Post-op Assessment: Report given to PACU RN, Post -op Vital signs reviewed and stable and Patient moving all extremities X 4  Post vital signs: stable  Complications: No apparent anesthesia complications

## 2014-01-24 NOTE — Anesthesia Postprocedure Evaluation (Signed)
  Anesthesia Post-op Note  Patient: Rhonda Castillo  Procedure(s) Performed: Procedure(s) (LRB): LAPAROSCOPIC GASTRIC SLEEVE RESECTION (N/A)  Patient Location: PACU  Anesthesia Type: General  Level of Consciousness: awake and alert   Airway and Oxygen Therapy: Patient Spontanous Breathing  Post-op Pain: mild  Post-op Assessment: Post-op Vital signs reviewed, Patient's Cardiovascular Status Stable, Respiratory Function Stable, Patent Airway and No signs of Nausea or vomiting  Last Vitals:  Filed Vitals:   01/24/14 1545  BP: 114/54  Pulse: 71  Temp: 36.5 C  Resp: 17    Post-op Vital Signs: stable   Complications: No apparent anesthesia complications

## 2014-01-24 NOTE — Anesthesia Preprocedure Evaluation (Addendum)
Anesthesia Evaluation  Patient identified by MRN, date of birth, ID band Patient awake    Reviewed: Allergy & Precautions, H&P , NPO status , Patient's Chart, lab work & pertinent test results  Airway Mallampati: II  TM Distance: >3 FB Neck ROM: Full    Dental no notable dental hx.    Pulmonary neg pulmonary ROS,  breath sounds clear to auscultation  Pulmonary exam normal       Cardiovascular negative cardio ROS  Rhythm:Regular Rate:Normal     Neuro/Psych negative neurological ROS  negative psych ROS   GI/Hepatic negative GI ROS, Neg liver ROS,   Endo/Other  Morbid obesity  Renal/GU negative Renal ROS  negative genitourinary   Musculoskeletal negative musculoskeletal ROS (+)   Abdominal   Peds negative pediatric ROS (+)  Hematology negative hematology ROS (+)   Anesthesia Other Findings   Reproductive/Obstetrics negative OB ROS                             Anesthesia Physical Anesthesia Plan  ASA: III  Anesthesia Plan: General   Post-op Pain Management:    Induction: Intravenous  Airway Management Planned: Oral ETT  Additional Equipment:   Intra-op Plan:   Post-operative Plan: Extubation in OR  Informed Consent: I have reviewed the patients History and Physical, chart, labs and discussed the procedure including the risks, benefits and alternatives for the proposed anesthesia with the patient or authorized representative who has indicated his/her understanding and acceptance.   Dental advisory given  Plan Discussed with: CRNA  Anesthesia Plan Comments:         Anesthesia Quick Evaluation  

## 2014-01-25 ENCOUNTER — Inpatient Hospital Stay (HOSPITAL_COMMUNITY): Payer: BC Managed Care – PPO

## 2014-01-25 LAB — HEMOGLOBIN AND HEMATOCRIT, BLOOD
HEMATOCRIT: 37.2 % (ref 36.0–46.0)
HEMOGLOBIN: 12.2 g/dL (ref 12.0–15.0)

## 2014-01-25 LAB — CBC WITH DIFFERENTIAL/PLATELET
BASOS PCT: 0 % (ref 0–1)
Basophils Absolute: 0 10*3/uL (ref 0.0–0.1)
EOS ABS: 0 10*3/uL (ref 0.0–0.7)
Eosinophils Relative: 0 % (ref 0–5)
HEMATOCRIT: 37.4 % (ref 36.0–46.0)
Hemoglobin: 12.2 g/dL (ref 12.0–15.0)
LYMPHS PCT: 18 % (ref 12–46)
Lymphs Abs: 2.1 10*3/uL (ref 0.7–4.0)
MCH: 29.7 pg (ref 26.0–34.0)
MCHC: 32.6 g/dL (ref 30.0–36.0)
MCV: 91 fL (ref 78.0–100.0)
MONOS PCT: 9 % (ref 3–12)
Monocytes Absolute: 1.1 10*3/uL — ABNORMAL HIGH (ref 0.1–1.0)
NEUTROS PCT: 73 % (ref 43–77)
Neutro Abs: 8.6 10*3/uL — ABNORMAL HIGH (ref 1.7–7.7)
Platelets: 342 10*3/uL (ref 150–400)
RBC: 4.11 MIL/uL (ref 3.87–5.11)
RDW: 12.4 % (ref 11.5–15.5)
WBC: 11.8 10*3/uL — ABNORMAL HIGH (ref 4.0–10.5)

## 2014-01-25 MED ORDER — IOHEXOL 300 MG/ML  SOLN
50.0000 mL | Freq: Once | INTRAMUSCULAR | Status: AC | PRN
  Administered 2014-01-25: 50 mL via ORAL

## 2014-01-25 MED ORDER — FLUOXETINE HCL 20 MG PO CAPS
20.0000 mg | ORAL_CAPSULE | Freq: Every morning | ORAL | Status: DC
  Administered 2014-01-25: 20 mg via ORAL
  Filled 2014-01-25 (×2): qty 1

## 2014-01-25 NOTE — Progress Notes (Signed)
Patient alert and oriented, pain is controlled. Patient is tolerating fluids, plan to advance to protein shake tomorrow.  Reviewed Gastric sleeve discharge instructions with patient and patient is able to articulate understanding.  Provided information on BELT program, Support Group and [facility] outpatient pharmacy. All questions answered, will continue to monitor.  

## 2014-01-25 NOTE — Discharge Instructions (Signed)

## 2014-01-25 NOTE — Care Management Note (Signed)
    Page 1 of 1   01/25/2014     10:39:00 AM CARE MANAGEMENT NOTE 01/25/2014  Patient:  Rhonda Castillo,Rhonda Castillo   Account Number:  [id number]  Date Initiated:  01/25/2014  Documentation initiated by:  Lorenda IshiharaPEELE,Adaline Trejos  Subjective/Objective Assessment:   43 yo female admitted s/p sleeve gastrectomy. PTA lived at home with mother.     Action/Plan:   Home when stable   Anticipated DC Date:  01/27/2014   Anticipated DC Plan:  HOME/SELF CARE      DC Planning Services  CM consult      Choice offered to / List presented to:             Status of service:  Completed, signed off Medicare Important Message given?   (If response is "NO", the following Medicare IM given date fields will be blank) Date Medicare IM given:   Medicare IM given by:   Date Additional Medicare IM given:   Additional Medicare IM given by:    Discharge Disposition:  HOME/SELF CARE  Per UR Regulation:  Reviewed for med. necessity/level of care/duration of stay  If discussed at Long Length of Stay Meetings, dates discussed:    Comments:

## 2014-01-25 NOTE — Progress Notes (Signed)
Patient alert and oriented, Post op day 2.  Provided support and encouragement.  Encouraged pulmonary toilet, ambulation and small sips of liquids.  All questions answered.  Will continue to monitor. 

## 2014-01-25 NOTE — Progress Notes (Signed)
Patient ID: Rhonda Castillo, female   DOB: 07/29/1970, 43 y.o.   MRN: [id number] 1 Day Post-Op  Subjective: Generally feels well. Some mild to moderate expected pain and mild nausea which is well controlled with medications. He has been walking frequently in the halls.  Objective: Vital signs in last 24 hours: Temp:  [97.5 F (36.4 C)-98.1 F (36.7 C)] 98 F (36.7 C) (12/30 0524) Pulse Rate:  [63-92] 80 (12/30 0524) Resp:  [10-18] 18 (12/30 0524) BP: (99-139)/(49-72) 110/50 mmHg (12/30 0524) SpO2:  [96 %-99 %] 98 % (12/30 0524) Weight:  [246 lb 11.1 oz (111.9 kg)] 246 lb 11.1 oz (111.9 kg) (12/29 1655) Last BM Date: 01/23/14  Intake/Output from previous day: 12/29 0701 - 12/30 0700 In: 2081.3 [I.V.:2081.3] Out: 900 [Urine:900] Intake/Output this shift: Total I/O In: -  Out: 450 [Urine:450]  General appearance: alert, cooperative and no distress Resp: no wheezing or increased work of breathing GI: normal findings: soft, non-tender Incision/Wound: clean and dry without evidence of infection  Lab Results:   Recent Labs  01/24/14 1921 01/25/14 0510  WBC  --  11.8*  HGB 13.1 12.2  HCT 40.0 37.4  PLT  --  342   BMET No results for input(s): NA, K, CL, CO2, GLUCOSE, BUN, CREATININE, CALCIUM in the last 72 hours.   Studies/Results: Gastrografin swallow by my reading and verbal report shows no evidence of leak or obstruction  Anti-infectives: Anti-infectives    Start     Dose/Rate Route Frequency Ordered Stop   01/24/14 0711  cefOXitin (MEFOXIN) 2 g in dextrose 5 % 50 mL IVPB     2 g100 mL/hr over 30 Minutes Intravenous On call to O.R. 01/24/14 0711 01/24/14 0930      Assessment/Plan: s/p Procedure(s): LAPAROSCOPIC GASTRIC SLEEVE RESECTION Doing well postoperatively without apparent complication. Start postoperative day #1 diet Continue ambulation and pulmonary toilet Anticipate discharge tomorrow   LOS: 1 day    Rhonda Castillo T 01/25/2014

## 2014-01-25 NOTE — Plan of Care (Signed)
Problem: Food- and Nutrition-Related Knowledge Deficit (NB-1.1) Goal: Nutrition education Formal process to instruct or train a patient/client in a skill or to impart knowledge to help patients/clients voluntarily manage or modify food choices and eating behavior to maintain or improve health. Outcome: Completed/Met Date Met:  01/25/14 Nutrition Education Note  Received consult for diet education per DROP protocol.   s/p 12/29 Procedure(s): LAPAROSCOPIC GASTRIC SLEEVE RESECTION  Discussed 2 week post op diet with pt. Emphasized that liquids must be non carbonated, non caffeinated, and sugar free. Fluid goals discussed. Pt to follow up with outpatient bariatric RD for further diet progression after 2 weeks. Multivitamins and minerals also reviewed. Teach back method used, pt expressed understanding, expect good compliance.   Diet: First 2 Weeks  You will see the nutritionist about two (2) weeks after your surgery. The nutritionist will increase the types of foods you can eat if you are handling liquids well:  If you have severe vomiting or nausea and cannot handle clear liquids lasting longer than 1 day, call your surgeon  Protein Shake  Drink at least 2 ounces of shake 5-6 times per day  Each serving of protein shakes (usually 8 - 12 ounces) should have a minimum of:  15 grams of protein  And no more than 5 grams of carbohydrate  Goal for protein each day:  Men = 80 grams per day  Women = 60 grams per day  Protein powder may be added to fluids such as non-fat milk or Lactaid milk or Soy milk (limit to 35 grams added protein powder per serving)   Hydration  Slowly increase the amount of water and other clear liquids as tolerated (See Acceptable Fluids)  Slowly increase the amount of protein shake as tolerated  Sip fluids slowly and throughout the day  May use sugar substitutes in small amounts (no more than 6 - 8 packets per day; i.e. Splenda)   Fluid Goal  The first goal is to drink  at least 8 ounces of protein shake/drink per day (or as directed by the nutritionist); some examples of protein shakes are [employer], [employer], EAS Edge HP, and Unjury. See handout from pre-op Bariatric Education Class:  Slowly increase the amount of protein shake you drink as tolerated  You may find it easier to slowly sip shakes throughout the day  It is important to get your proteins in first  Your fluid goal is to drink 64 - 100 ounces of fluid daily  It may take a few weeks to build up to this  32 oz (or more) should be clear liquids  And  32 oz (or more) should be full liquids (see below for examples)  Liquids should not contain sugar, caffeine, or carbonation   Clear Liquids:  Water or Sugar-free flavored water (i.e. Fruit H2O, Propel)  Decaffeinated coffee or tea (sugar-free)  Crystal Lite, Wyler's Lite, Minute Maid Lite  Sugar-free Jell-O  Bouillon or broth  Sugar-free Popsicle: *Less than 20 calories each; Limit 1 per day   Full Liquids:  Protein Shakes/Drinks + 2 choices per day of other full liquids  Full liquids must be:  No More Than 12 grams of Carbs per serving  No More Than 3 grams of Fat per serving  Strained low-fat cream soup  Non-Fat milk  Fat-free Lactaid Milk  Sugar-free yogurt (Dannon Lite & Fit, Greek yogurt)     Clayton Bibles, MS, RD, LDN Pager: [phone] After Hours Pager: [phone]

## 2014-01-26 ENCOUNTER — Encounter (HOSPITAL_COMMUNITY): Payer: Self-pay | Admitting: General Surgery

## 2014-01-26 LAB — CBC WITH DIFFERENTIAL/PLATELET
BASOS ABS: 0 10*3/uL (ref 0.0–0.1)
BASOS PCT: 0 % (ref 0–1)
Eosinophils Absolute: 0.1 10*3/uL (ref 0.0–0.7)
Eosinophils Relative: 1 % (ref 0–5)
HCT: 38.7 % (ref 36.0–46.0)
Hemoglobin: 12.5 g/dL (ref 12.0–15.0)
LYMPHS PCT: 42 % (ref 12–46)
Lymphs Abs: 4 10*3/uL (ref 0.7–4.0)
MCH: 29.8 pg (ref 26.0–34.0)
MCHC: 32.3 g/dL (ref 30.0–36.0)
MCV: 92.4 fL (ref 78.0–100.0)
Monocytes Absolute: 0.6 10*3/uL (ref 0.1–1.0)
Monocytes Relative: 7 % (ref 3–12)
NEUTROS ABS: 4.9 10*3/uL (ref 1.7–7.7)
NEUTROS PCT: 50 % (ref 43–77)
Platelets: 391 10*3/uL (ref 150–400)
RBC: 4.19 MIL/uL (ref 3.87–5.11)
RDW: 12.7 % (ref 11.5–15.5)
WBC: 9.7 10*3/uL (ref 4.0–10.5)

## 2014-01-26 NOTE — Progress Notes (Signed)
Nurse reviewed discharge instructions with pt.  Pt verbalized understanding of discharge instructions, follow up appointments and new medications.  No concerns at time of discharge. 

## 2014-01-26 NOTE — Discharge Summary (Signed)
   Patient ID: Raymond GurneyKimberly R Valera [id number] 43 y.o. 06/09/1970  01/24/2014  Discharge date and time: 01/26/2014   Admitting Physician: Glenna FellowsHOXWORTH,Rakesha Dalporto T  Discharge Physician: Glenna FellowsHOXWORTH,Makayah Pauli T  Admission Diagnoses: Morbid Obesity  Discharge Diagnoses: Same  Operations: Procedure(s): LAPAROSCOPIC GASTRIC SLEEVE RESECTION  Admission Condition: good  Discharged Condition: good  Indication for Admission: Patient is a 43 year old female with progressive morbid obesity unresponsive to medical management who presents at a BMI of 44. After extensive preoperative workup and discussion detailed elsewhere she is electively admitted for laparoscopic sleeve gastrectomy for treatment of her obesity  Hospital Course: On the morning of admission the patient underwent an uneventful laparoscopic sleeve gastrectomy. Her postoperative course was uncomplicated. Gastrografin swallow on the first postoperative day showed no leak or obstruction and she was started on water which she tolerated well. On the second postoperative day she is afebrile with normal vital signs. Denies any pain or nausea. CBC is normal. Abdomen is soft and nontender incisions healing well. She is starting protein shakes and will be discharged if she tolerates these well.   Disposition: Home  Patient Instructions:    Medication List    TAKE these medications        Calcium 500 MG Chew  Chew 1 tablet by mouth daily.     FLUoxetine 20 MG capsule  Commonly known as:  PROZAC  Take 20 mg by mouth every morning.     multivitamin with minerals Tabs tablet  Take 1 tablet by mouth daily.     Vitamin B-12 2500 MCG Subl  Place 1 tablet under the tongue daily.        Activity: activity as tolerated Diet: bariatric protein shakes Wound Care: none needed  Follow-up:  With Dr. Johna SheriffHoxworth in 3 weeks.  Signed: Mariella SaaBenjamin T Sheniya Garciaperez MD, FACS  01/26/2014, 8:07 AM

## 2014-01-30 ENCOUNTER — Telehealth (HOSPITAL_COMMUNITY): Payer: Self-pay

## 2014-01-30 NOTE — Telephone Encounter (Signed)

## 2014-01-31 ENCOUNTER — Encounter: Payer: BC Managed Care – PPO | Attending: General Surgery

## 2014-01-31 DIAGNOSIS — E669 Obesity, unspecified: Secondary | ICD-10-CM | POA: Diagnosis present

## 2014-01-31 DIAGNOSIS — Z713 Dietary counseling and surveillance: Secondary | ICD-10-CM | POA: Insufficient documentation

## 2014-01-31 DIAGNOSIS — Z6841 Body Mass Index (BMI) 40.0 and over, adult: Secondary | ICD-10-CM | POA: Insufficient documentation

## 2014-01-31 NOTE — Progress Notes (Signed)
Bariatric Class:  Appt start time: 1530 end time:  1630.  2 Week Post-Operative Nutrition Class  Patient was seen on 01/31/14 for Post-Operative Nutrition education at [facility].   Surgery date: 01/24/14 Surgery type: gastric sleeve Start weight at [facility]: 248 lbs on 10/20/13 Weight today: 236.0 lbs  Weight change: 11 lbs  TANITA  BODY COMP RESULTS  01/09/14 01/31/14   BMI (kg/m^2) 45.2 43.2   Fat Mass (lbs) 122.5 119.5   Fat Free Mass (lbs) 124.5 116.5   Total Body Water (lbs) 91 85.5    The following the learning objectives were met by the patient during this course:  Identifies Phase 3A (Soft, High Proteins) Dietary Goals and will begin from 2 weeks post-operatively to 2 months post-operatively  Identifies appropriate sources of fluids and proteins   States protein recommendations and appropriate sources post-operatively  Identifies the need for appropriate texture modifications, mastication, and bite sizes when consuming solids  Identifies appropriate multivitamin and calcium sources post-operatively  Describes the need for physical activity post-operatively and will follow MD recommendations  States when to call healthcare provider regarding medication questions or post-operative complications  Handouts given during class include:  Phase 3A: Soft, High Protein Diet Handout  Follow-Up Plan: Patient will follow-up at [facility] in 6 weeks for 2 month post-op nutrition visit for diet advancement per MD.

## 2014-03-13 ENCOUNTER — Encounter: Payer: BC Managed Care – PPO | Attending: General Surgery | Admitting: Dietician

## 2014-03-13 ENCOUNTER — Ambulatory Visit: Payer: BC Managed Care – PPO | Admitting: Dietician

## 2014-03-13 DIAGNOSIS — Z713 Dietary counseling and surveillance: Secondary | ICD-10-CM | POA: Diagnosis not present

## 2014-03-13 DIAGNOSIS — E669 Obesity, unspecified: Secondary | ICD-10-CM | POA: Diagnosis present

## 2014-03-13 DIAGNOSIS — Z6841 Body Mass Index (BMI) 40.0 and over, adult: Secondary | ICD-10-CM | POA: Insufficient documentation

## 2014-03-13 NOTE — Progress Notes (Signed)
  Follow-up visit:  6 Weeks Post-Operative Gastric Sleeve Surgery  Medical Nutrition Therapy:  Appt start time: 220 end time:  240  Primary concerns today: Post-operative Bariatric Surgery Nutrition Management. Cala BradfordKimberly returns stating that she is tolerating all foods except spicy foods. Already eating vegetables and tolerating. She is tracking foods on My Fitness Pal.  Surgery date: 01/24/14 Surgery type: gastric sleeve [facility] lbs on 10/20/13 Weight today: 220.5 lbs Weight change: 16 lbs Total weight lost: 27.5  TANITA  BODY COMP RESULTS  01/09/14 01/31/14 03/13/14   BMI (kg/m^2) 45.2 43.2 40.3   Fat Mass (lbs) 122.5 119.5 100.5   Fat Free Mass (lbs) 124.5 116.5 120   Total Body Water (lbs) 91 85.5 88     Preferred Learning Style:   No preference indicated   Learning Readiness:   Ready  24-hr recall: B (AM): premier protein shake with skim milk (38g) Snk (AM): boiled egg or cheese  L (PM): 2 deli meat slices with cheese OR chili (12-14g)  Snk (PM): Oikos Triple Zero yogurt (15g)  D (PM): 3 oz chicken or 6 oz shrimp with kale, broccoli or okra (21-35g) Snk (PM):   Fluid intake: 2 cups caffeinated black coffee, water, decaf green tea (64 oz) Estimated total protein intake: 80-120 grams per day per patient  Medications: see list Supplementation: taking  Using straws: sometimes, no gas pain Drinking while eating: occasionally Hair loss: none, taking Biotin Carbonated beverages: no N/V/D/C: diarrhea with too much coffee Dumping syndrome: yes, due to too much coffee  Recent physical activity:  Walking/jogging intervals, elliptical for 40 minutes, plans to start biking and swimming (10,000-13,000 steps per day)  Progress Towards Goal(s):  In progress.  Handouts given during visit include:  Phase 3B lean protein + non starchy vegetables  Bariatric Advantage Calcium citrate (caramel - qty 2) Lot#: [id number] Exp: 05/2014  Bariatric Advantage Calcium  citrate (orange - qty 2) Lot#: [id number] Exp: 05/2014  Nutritional Diagnosis:  [place]-3.3 Overweight/obesity related to past poor dietary habits and physical inactivity as evidenced by patient w/ recent gastric sleeve surgery following dietary guidelines for continued weight loss.     Intervention:  Nutrition counseling provided.  Teaching Method Utilized:  Visual Auditory  Barriers to learning/adherence to lifestyle change: none  Demonstrated degree of understanding via:  Teach Back   Monitoring/Evaluation:  Dietary intake, exercise, and body weight. Follow up in 2-3 months for 4 month post-op visit.

## 2014-03-13 NOTE — Patient Instructions (Signed)
TANITA  BODY COMP RESULTS  01/09/14 01/31/14 03/13/14   BMI (kg/m^2) 45.2 43.2 40.3   Fat Mass (lbs) 122.5 119.5 100.5   Fat Free Mass (lbs) 124.5 116.5 120   Total Body Water (lbs) 91 85.5 88

## 2014-05-30 ENCOUNTER — Ambulatory Visit: Payer: BC Managed Care – PPO | Admitting: Dietician

## 2014-06-28 ENCOUNTER — Encounter: Payer: BC Managed Care – PPO | Attending: General Surgery | Admitting: Dietician

## 2014-06-28 ENCOUNTER — Encounter: Payer: Self-pay | Admitting: Dietician

## 2014-06-28 DIAGNOSIS — Z713 Dietary counseling and surveillance: Secondary | ICD-10-CM | POA: Insufficient documentation

## 2014-06-28 DIAGNOSIS — Z6835 Body mass index (BMI) 35.0-35.9, adult: Secondary | ICD-10-CM | POA: Diagnosis not present

## 2014-06-28 NOTE — Patient Instructions (Signed)
TANITA  BODY COMP RESULTS  01/09/14 01/31/14 03/13/14 06/28/14   BMI (kg/m^2) 45.2 43.2 40.3 35.9   Fat Mass (lbs) 122.5 119.5 100.5 75.5   Fat Free Mass (lbs) 124.5 116.5 120 120.5   Total Body Water (lbs) 91 85.5 88 88

## 2014-06-28 NOTE — Progress Notes (Signed)
  Follow-up visit:  5 months Post-Operative Gastric Sleeve Surgery  Medical Nutrition Therapy:  Appt start time: 430 end time:  455  Primary concerns today: Post-operative Bariatric Surgery Nutrition Management. Cala BradfordKimberly returns having lost another 24 pounds. She states that she is feeling "amazing" and is excited about her weight loss. She thinks she may be becoming lactose intolerant but can tolerate yogurt and cheese.   Non scale victories: -Able to ride a bike easily -Pant size: 14 -Shopping for clothes in regular stores -Able to cross legs   Surgery date: 01/24/14 Surgery type: gastric sleeve [facility]: 248 lbs on 10/20/13 Weight today: 196.5 lbs Weight change: 24 lbs Total weight lost: 51.5 Weight loss goal: 165-170 lbs  TANITA  BODY COMP RESULTS  01/09/14 01/31/14 03/13/14 06/28/14   BMI (kg/m^2) 45.2 43.2 40.3 35.9   Fat Mass (lbs) 122.5 119.5 100.5 75.5   Fat Free Mass (lbs) 124.5 116.5 120 120.5   Total Body Water (lbs) 91 85.5 88 88     Preferred Learning Style:   No preference indicated   Learning Readiness:   Ready  24-hr recall: B (AM): premier protein shake with almond milk (30g) Snk (AM): boiled egg or cheese  L (PM): 2 deli meat slices with cheese OR chili (12-14g)  Snk (PM): Oikos Triple Zero yogurt (15g)  D (PM): 3 oz chicken or 6 oz shrimp with kale, broccoli or okra (21-35g) Snk (PM):   Fluid intake: 2 cups caffeinated black coffee, water, decaf green tea (64 oz) Estimated total protein intake: 80-120 grams per day per patient  Medications: see list Supplementation: taking  Using straws: sometimes, no gas pain Drinking while eating: none Hair loss: "I think so," taking Biotin Carbonated beverages: no N/V/D/C: occasional constipation and diarrhea Dumping syndrome: occasionally, unsure why. Thinks it may be due to drinking while eating  Recent physical activity:  Walking/jogging intervals, elliptical for 40 minutes, plans to start  biking and swimming (10,000-13,000 steps per day); daily exercise  Progress Towards Goal(s):  In progress.   Nutritional Diagnosis:  [place]-3.3 Overweight/obesity related to past poor dietary habits and physical inactivity as evidenced by patient w/ recent gastric sleeve surgery following dietary guidelines for continued weight loss.     Intervention:  Nutrition counseling provided.  Teaching Method Utilized:  Visual Auditory  Barriers to learning/adherence to lifestyle change: none  Demonstrated degree of understanding via:  Teach Back   Monitoring/Evaluation:  Dietary intake, exercise, and body weight. Follow up in 4 months for 9 month post-op visit.

## 2014-10-30 ENCOUNTER — Encounter: Payer: BC Managed Care – PPO | Attending: General Surgery | Admitting: Dietician

## 2014-10-30 ENCOUNTER — Encounter: Payer: Self-pay | Admitting: Dietician

## 2014-10-30 DIAGNOSIS — Z713 Dietary counseling and surveillance: Secondary | ICD-10-CM | POA: Insufficient documentation

## 2014-10-30 DIAGNOSIS — Z6833 Body mass index (BMI) 33.0-33.9, adult: Secondary | ICD-10-CM | POA: Diagnosis not present

## 2014-10-30 NOTE — Patient Instructions (Addendum)
  TANITA  BODY COMP RESULTS  01/09/14 01/31/14 03/13/14 06/28/14 10/30/14   BMI (kg/m^2) 45.2 43.2 40.3 35.9 33.1   Fat Mass (lbs) 122.5 119.5 100.5 75.5 66   Fat Free Mass (lbs) 124.5 116.5 120 120.5 115   Total Body Water (lbs) 91 85.5 88 88 84

## 2014-10-30 NOTE — Progress Notes (Signed)
  Follow-up visit:  9 months Post-Operative Gastric Sleeve Surgery  Medical Nutrition Therapy:  Appt start time: 430 end time:  500  Primary concerns today: Post-operative Bariatric Surgery Nutrition Management. Rhonda Castillo returns having lost another 15 pounds. She feels comfortable at her current weight although she states she may want to reach 170 lbs. She has been adding in some foods like chicken pot pie, pizza, and beer. She continues to follow diet recommendations and has these foods only occasionally. She also works to eat small portions. She monitors her weight once every week or 2.   Non scale victories: -Able to ride a bike easily -Pant size: 12-14 (shirt size medium or large) -Shopping for clothes in regular stores -Able to cross legs -Comfortable in airplane seat  Surgery date: 01/24/14 Surgery type: gastric sleeve Start weight at [facility]: 248 lbs on 10/20/13 Weight today: 181 lbs Weight change: 15.5 lbs Total weight lost: 67 Weight loss goal: 170 lbs  TANITA  BODY COMP RESULTS  01/09/14 01/31/14 03/13/14 06/28/14 10/30/14   BMI (kg/m^2) 45.2 43.2 40.3 35.9 33.1   Fat Mass (lbs) 122.5 119.5 100.5 75.5 66   Fat Free Mass (lbs) 124.5 116.5 120 120.5 115   Total Body Water (lbs) 91 85.5 88 88 84     Preferred Learning Style:   No preference indicated   Learning Readiness:   Ready  24-hr recall: B (AM): premier protein shake with almond milk (30g) Snk (AM): boiled egg or cheese  L (PM): salad or chicken pot pie Snk (PM): Oikos Triple Zero yogurt (15g)  D (PM): 3 oz chicken or 6 oz shrimp with kale, broccoli or okra (21-35g) Snk (PM):   Fluid intake: 2 cups caffeinated black coffee, water, decaf green tea (64 oz) Estimated total protein intake: 80-120 grams per day per patient  Medications: see list Supplementation: taking, having trouble with Calcium but drinks 8 oz milk daily  Using straws: sometimes, no gas pain Drinking while eating: none Hair loss: "I think  so," taking Biotin Carbonated beverages: beer and sparkling water N/V/D/C: often has loose stools (she thinks this may be due to stress) Dumping syndrome: occasionally, unsure why. Thinks it may be due to drinking while eating  Recent physical activity:  Walking/jogging intervals, elliptical for 40 minutes, plans to start biking and swimming (10,000-13,000 steps per day); daily exercise  Progress Towards Goal(s):  In progress.   Nutritional Diagnosis:  [place]-3.3 Overweight/obesity related to past poor dietary habits and physical inactivity as evidenced by patient w/ recent gastric sleeve surgery following dietary guidelines for continued weight loss.     Intervention:  Nutrition counseling provided.  Teaching Method Utilized:  Visual Auditory  Barriers to learning/adherence to lifestyle change: none  Demonstrated degree of understanding via:  Teach Back   Monitoring/Evaluation:  Dietary intake, exercise, and body weight. Follow up in 6 months for 15 month post-op visit.

## 2015-02-13 ENCOUNTER — Other Ambulatory Visit: Payer: Self-pay

## 2015-02-13 DIAGNOSIS — Z1231 Encounter for screening mammogram for malignant neoplasm of breast: Secondary | ICD-10-CM

## 2015-03-01 ENCOUNTER — Ambulatory Visit
Admission: RE | Admit: 2015-03-01 | Discharge: 2015-03-01 | Disposition: A | Discharge status: Home / Self Care | Payer: BC Managed Care – PPO | Source: Ambulatory Visit

## 2015-03-01 DIAGNOSIS — Z1231 Encounter for screening mammogram for malignant neoplasm of breast: Secondary | ICD-10-CM

## 2015-04-30 ENCOUNTER — Encounter: Payer: BC Managed Care – PPO | Attending: General Surgery | Admitting: Dietician

## 2015-04-30 DIAGNOSIS — Z029 Encounter for administrative examinations, unspecified: Secondary | ICD-10-CM | POA: Diagnosis present

## 2015-04-30 NOTE — Progress Notes (Signed)
  Follow-up visit:  15 months Post-Operative Gastric Sleeve Surgery  Medical Nutrition Therapy:  Appt start time: 445 end time:  510  Primary concerns today: Post-operative Bariatric Surgery Nutrition Management. Rhonda Castillo returns having gained a few pounds. Per [facility] Body composition scale 2 pounds of this gain is water. Patient states she was out of town this weekend and has been eating out more. She reports she is happy to keep her weight within 170-180 lbs. She reports that she has recently started trying to eat more protein and less processed foods. 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 She continues to maintain a balanced diet and is excited to have kept her weight off.    Non scale victories: -Able to ride a bike easily -Pant size: 12-14 (shirt size medium or large) -Shopping for clothes in regular stores -Able to cross legs -Comfortable in airplane seat  Surgery date: 01/24/14 Surgery type: gastric sleeve [facility] lbs on 10/20/13 Weight today: 185.5 lbs Weight change: 4.5 lbs gain Total weight lost: 62.5 Weight loss goal: 170-180 lbs  TANITA  BODY COMP RESULTS  01/09/14 01/31/14 03/13/14 06/28/14 10/30/14 04/30/15   BMI (kg/m^2) 45.2 43.2 40.3 35.9 33.1 33.9   Fat Mass (lbs) 122.5 119.5 100.5 75.5 66 68.5   Fat Free Mass (lbs) 124.5 116.5 120 120.5 115 117   Total Body Water (lbs) 91 85.5 88 88 84 85.5     Preferred Learning Style:   No preference indicated   Learning Readiness:   Ready  24-hr recall: B (AM): Malaw[place]urkey egg white breakfast sandwich (18g) Snk (AM): L (PM): 1 slice pizza or leftovers Snk (PM): pistachios and fruit or babybel cheese D (PM): meat and vegetables  Snk (PM):   Fluid intake: several cups caffeinated black coffee, water, decaf green tea (64 oz) Estimated total protein intake: 80-120 grams per day per patient  Medications: see list Supplementation: taking, having trouble with Calcium but drinks 8 oz  milk daily  Using straws: sometimes, no gas pain Drinking while eating: none Hair loss: "I think so," taking Biotin Carbonated beverages: beer and sparkling water N/V/D/C: often has loose stools (she thinks this may be due to stress) Dumping syndrome: occasionally, unsure why. Thinks it may be due to drinking while eating  Recent physical activity:  Walking every morning   Progress Towards Goal(s):  In progress.   Nutritional Diagnosis:  [place]-3.3 Overweight/obesity related to past poor dietary habits and physical inactivity as evidenced by patient w/ recent gastric sleeve surgery following dietary guidelines for continued weight loss.     Intervention:  Nutrition counseling provided.  Teaching Method Utilized:  Visual Auditory  Barriers to learning/adherence to lifestyle change: none  Demonstrated degree of understanding via:  Teach Back   Monitoring/Evaluation:  Dietary intake, exercise, and body weight. Follow up prn.

## 2015-05-01 ENCOUNTER — Encounter: Payer: Self-pay | Admitting: Dietician

## 2015-11-27 ENCOUNTER — Encounter (HOSPITAL_COMMUNITY): Payer: Self-pay

## 2016-08-27 ENCOUNTER — Encounter (HOSPITAL_COMMUNITY): Payer: Self-pay

## 2021-03-01 ENCOUNTER — Other Ambulatory Visit: Payer: Self-pay | Admitting: Family Medicine

## 2021-03-01 DIAGNOSIS — Z1231 Encounter for screening mammogram for malignant neoplasm of breast: Secondary | ICD-10-CM

## 2021-03-14 ENCOUNTER — Other Ambulatory Visit: Payer: Self-pay

## 2021-03-14 ENCOUNTER — Ambulatory Visit
Admission: RE | Admit: 2021-03-14 | Discharge: 2021-03-14 | Disposition: A | Discharge status: Home / Self Care | Payer: BC Managed Care – PPO | Source: Ambulatory Visit | Attending: Family Medicine | Admitting: Family Medicine

## 2021-03-14 DIAGNOSIS — Z1231 Encounter for screening mammogram for malignant neoplasm of breast: Secondary | ICD-10-CM

## 2021-03-18 ENCOUNTER — Other Ambulatory Visit: Payer: Self-pay | Admitting: Family Medicine

## 2021-03-18 DIAGNOSIS — R928 Other abnormal and inconclusive findings on diagnostic imaging of breast: Secondary | ICD-10-CM

## 2021-03-28 DIAGNOSIS — L738 Other specified follicular disorders: Secondary | ICD-10-CM | POA: Diagnosis not present

## 2021-03-28 DIAGNOSIS — L820 Inflamed seborrheic keratosis: Secondary | ICD-10-CM | POA: Diagnosis not present

## 2021-03-28 DIAGNOSIS — L718 Other rosacea: Secondary | ICD-10-CM | POA: Diagnosis not present

## 2021-03-28 DIAGNOSIS — Z85828 Personal history of other malignant neoplasm of skin: Secondary | ICD-10-CM | POA: Diagnosis not present

## 2021-04-01 ENCOUNTER — Other Ambulatory Visit: Payer: Self-pay | Admitting: Physician Assistant

## 2021-04-01 DIAGNOSIS — Z1211 Encounter for screening for malignant neoplasm of colon: Secondary | ICD-10-CM | POA: Diagnosis not present

## 2021-04-01 DIAGNOSIS — R1032 Left lower quadrant pain: Secondary | ICD-10-CM

## 2021-04-02 DIAGNOSIS — E041 Nontoxic single thyroid nodule: Secondary | ICD-10-CM | POA: Diagnosis not present

## 2021-04-02 DIAGNOSIS — Z13220 Encounter for screening for lipoid disorders: Secondary | ICD-10-CM | POA: Diagnosis not present

## 2021-04-02 DIAGNOSIS — Z79899 Other long term (current) drug therapy: Secondary | ICD-10-CM | POA: Diagnosis not present

## 2021-04-02 DIAGNOSIS — M5432 Sciatica, left side: Secondary | ICD-10-CM | POA: Diagnosis not present

## 2021-04-02 DIAGNOSIS — M25552 Pain in left hip: Secondary | ICD-10-CM | POA: Diagnosis not present

## 2021-04-09 ENCOUNTER — Ambulatory Visit: Payer: BC Managed Care – PPO

## 2021-04-09 ENCOUNTER — Ambulatory Visit
Admission: RE | Admit: 2021-04-09 | Discharge: 2021-04-09 | Disposition: A | Discharge status: Home / Self Care | Payer: BC Managed Care – PPO | Source: Ambulatory Visit | Attending: Family Medicine | Admitting: Family Medicine

## 2021-04-09 ENCOUNTER — Other Ambulatory Visit: Payer: Self-pay

## 2021-04-09 DIAGNOSIS — R928 Other abnormal and inconclusive findings on diagnostic imaging of breast: Secondary | ICD-10-CM

## 2021-04-09 DIAGNOSIS — R922 Inconclusive mammogram: Secondary | ICD-10-CM | POA: Diagnosis not present

## 2021-04-18 DIAGNOSIS — D2262 Melanocytic nevi of left upper limb, including shoulder: Secondary | ICD-10-CM | POA: Diagnosis not present

## 2021-04-18 DIAGNOSIS — L814 Other melanin hyperpigmentation: Secondary | ICD-10-CM | POA: Diagnosis not present

## 2021-04-18 DIAGNOSIS — L821 Other seborrheic keratosis: Secondary | ICD-10-CM | POA: Diagnosis not present

## 2021-04-18 DIAGNOSIS — Z85828 Personal history of other malignant neoplasm of skin: Secondary | ICD-10-CM | POA: Diagnosis not present

## 2021-04-22 ENCOUNTER — Ambulatory Visit
Admission: RE | Admit: 2021-04-22 | Discharge: 2021-04-22 | Disposition: A | Discharge status: Home / Self Care | Payer: BC Managed Care – PPO | Source: Ambulatory Visit | Attending: Physician Assistant | Admitting: Physician Assistant

## 2021-04-22 DIAGNOSIS — R109 Unspecified abdominal pain: Secondary | ICD-10-CM | POA: Diagnosis not present

## 2021-04-22 DIAGNOSIS — R197 Diarrhea, unspecified: Secondary | ICD-10-CM | POA: Diagnosis not present

## 2021-04-22 DIAGNOSIS — R1032 Left lower quadrant pain: Secondary | ICD-10-CM

## 2021-04-22 MED ORDER — IOPAMIDOL (ISOVUE-300) INJECTION 61%
100.0000 mL | Freq: Once | INTRAVENOUS | Status: AC | PRN
  Administered 2021-04-22: 100 mL via INTRAVENOUS

## 2021-04-24 DIAGNOSIS — E0590 Thyrotoxicosis, unspecified without thyrotoxic crisis or storm: Secondary | ICD-10-CM | POA: Diagnosis not present

## 2021-04-24 DIAGNOSIS — R52 Pain, unspecified: Secondary | ICD-10-CM | POA: Diagnosis not present

## 2021-04-24 DIAGNOSIS — M2550 Pain in unspecified joint: Secondary | ICD-10-CM | POA: Diagnosis not present

## 2021-04-24 DIAGNOSIS — Q189 Congenital malformation of face and neck, unspecified: Secondary | ICD-10-CM | POA: Diagnosis not present

## 2021-04-30 DIAGNOSIS — M2550 Pain in unspecified joint: Secondary | ICD-10-CM | POA: Diagnosis not present

## 2021-04-30 DIAGNOSIS — Q189 Congenital malformation of face and neck, unspecified: Secondary | ICD-10-CM | POA: Diagnosis not present

## 2021-04-30 DIAGNOSIS — E0590 Thyrotoxicosis, unspecified without thyrotoxic crisis or storm: Secondary | ICD-10-CM | POA: Diagnosis not present

## 2021-05-24 DIAGNOSIS — R300 Dysuria: Secondary | ICD-10-CM | POA: Diagnosis not present

## 2021-05-24 DIAGNOSIS — N3000 Acute cystitis without hematuria: Secondary | ICD-10-CM | POA: Diagnosis not present

## 2021-06-10 DIAGNOSIS — K6289 Other specified diseases of anus and rectum: Secondary | ICD-10-CM | POA: Diagnosis not present

## 2021-06-10 DIAGNOSIS — Z1211 Encounter for screening for malignant neoplasm of colon: Secondary | ICD-10-CM | POA: Diagnosis not present

## 2021-06-10 DIAGNOSIS — K5730 Diverticulosis of large intestine without perforation or abscess without bleeding: Secondary | ICD-10-CM | POA: Diagnosis not present

## 2021-06-19 DIAGNOSIS — N925 Other specified irregular menstruation: Secondary | ICD-10-CM | POA: Diagnosis not present

## 2021-06-19 DIAGNOSIS — Z124 Encounter for screening for malignant neoplasm of cervix: Secondary | ICD-10-CM | POA: Diagnosis not present

## 2021-06-19 DIAGNOSIS — Z6837 Body mass index (BMI) 37.0-37.9, adult: Secondary | ICD-10-CM | POA: Diagnosis not present

## 2021-06-19 DIAGNOSIS — Z113 Encounter for screening for infections with a predominantly sexual mode of transmission: Secondary | ICD-10-CM | POA: Diagnosis not present

## 2021-06-19 DIAGNOSIS — Z01419 Encounter for gynecological examination (general) (routine) without abnormal findings: Secondary | ICD-10-CM | POA: Diagnosis not present

## 2021-06-28 DIAGNOSIS — M545 Low back pain, unspecified: Secondary | ICD-10-CM | POA: Diagnosis not present

## 2021-06-28 DIAGNOSIS — G8929 Other chronic pain: Secondary | ICD-10-CM | POA: Diagnosis not present

## 2021-07-03 DIAGNOSIS — N925 Other specified irregular menstruation: Secondary | ICD-10-CM | POA: Diagnosis not present

## 2023-06-29 ENCOUNTER — Other Ambulatory Visit: Payer: Self-pay | Admitting: Family Medicine

## 2023-06-29 DIAGNOSIS — Z1231 Encounter for screening mammogram for malignant neoplasm of breast: Secondary | ICD-10-CM

## 2023-07-27 ENCOUNTER — Encounter

## 2023-07-27 DIAGNOSIS — Z1231 Encounter for screening mammogram for malignant neoplasm of breast: Secondary | ICD-10-CM

## 2023-07-29 ENCOUNTER — Ambulatory Visit
Admission: RE | Admit: 2023-07-29 | Discharge: 2023-07-29 | Disposition: A | Discharge status: Home / Self Care | Source: Ambulatory Visit | Attending: Family Medicine | Admitting: Family Medicine

## 2023-07-29 DIAGNOSIS — Z1231 Encounter for screening mammogram for malignant neoplasm of breast: Secondary | ICD-10-CM

## 2023-11-24 IMAGING — MG MM DIGITAL DIAGNOSTIC UNILAT*R* W/ TOMO W/ CAD
7 series · 9 of 15 positions shown · non-contrast
Comparison: Previous exam(s).

CLINICAL DATA: Screening recall for a possible right breast
asymmetry and right breast calcifications.

EXAM:
DIGITAL DIAGNOSTIC UNILATERAL RIGHT MAMMOGRAM WITH TOMOSYNTHESIS AND
CAD
TECHNIQUE: Right digital diagnostic mammography and breast tomosynthesis was
performed. The images were evaluated with computer-aided detection.

[R ML (1 of 2)]
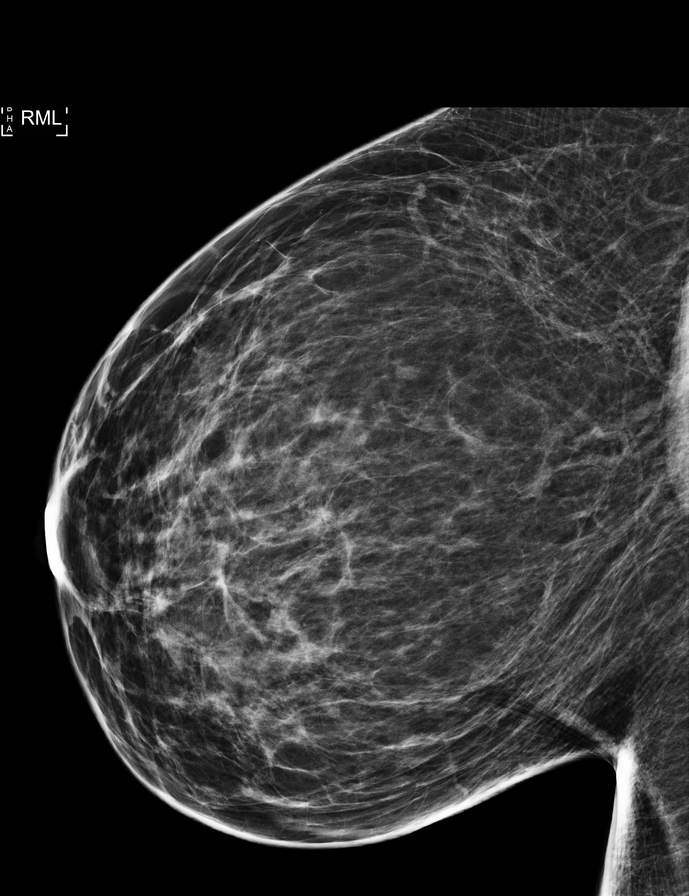

[R ML (2 of 2)]
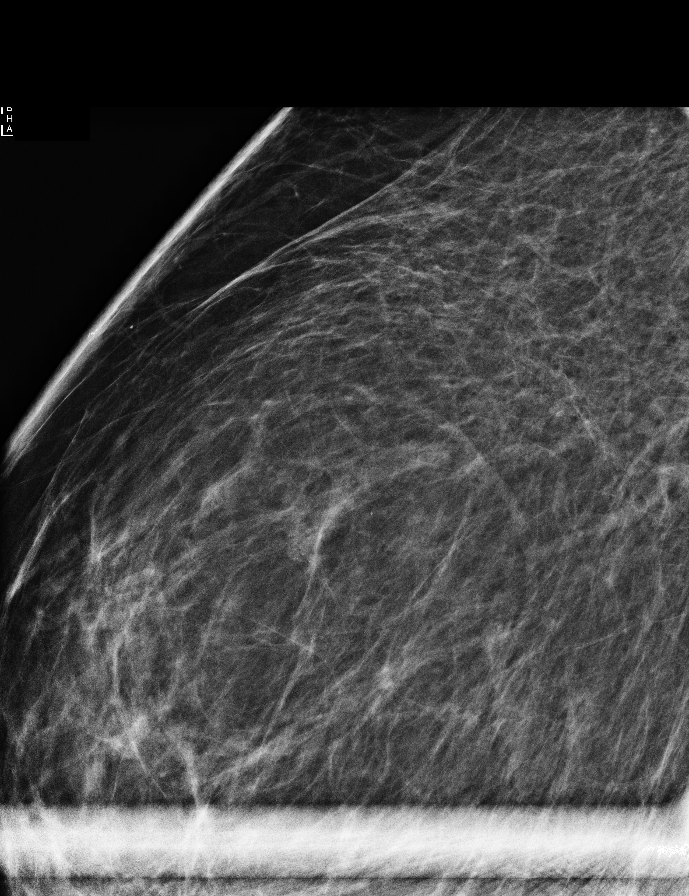

[R CC]
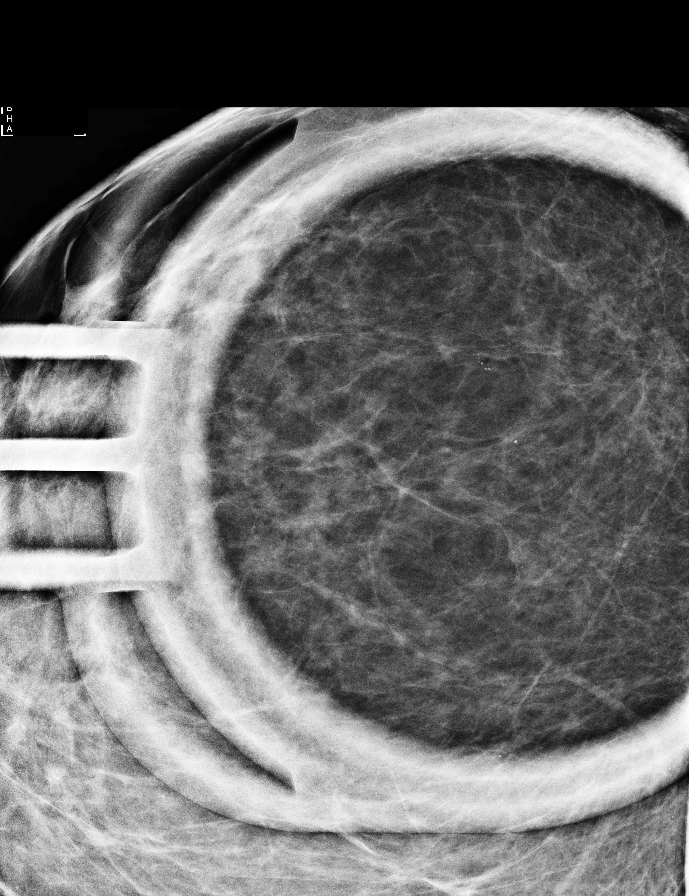

[R CC synth-2D]
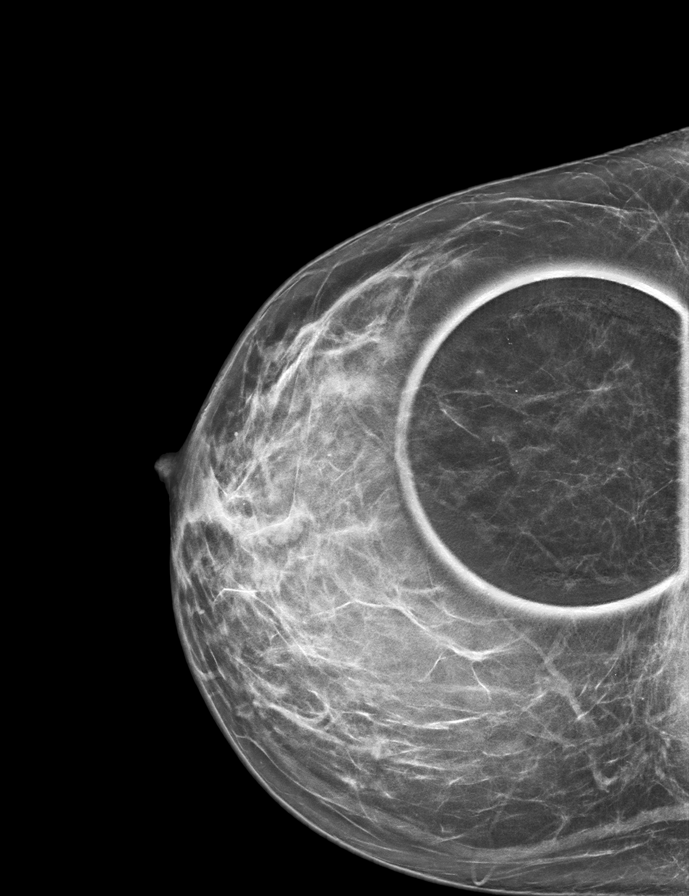

[R MLO synth-2D]
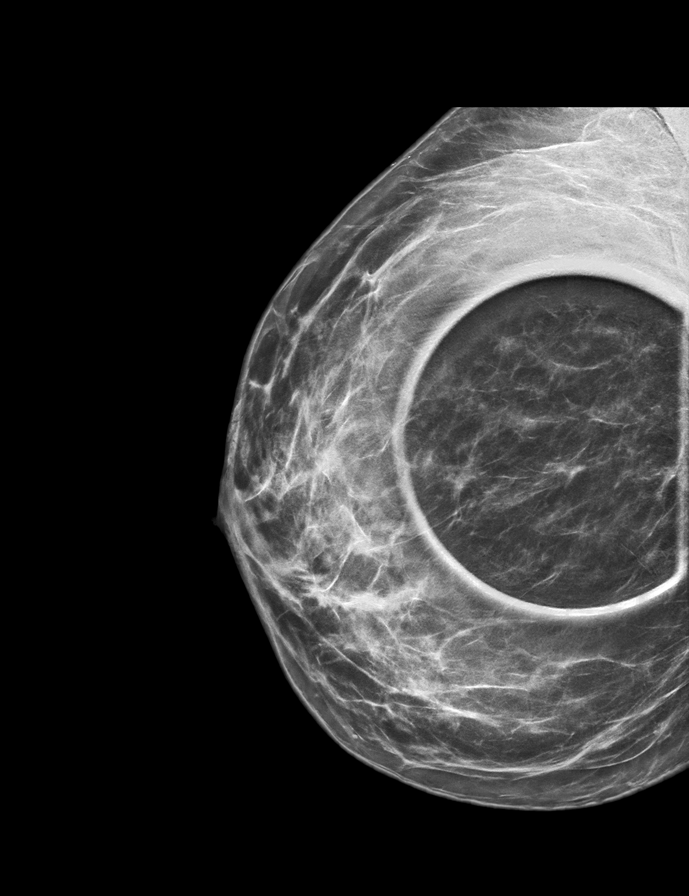

[R CC tomo · 3 of 58 frames shown]
[frame 19/58]
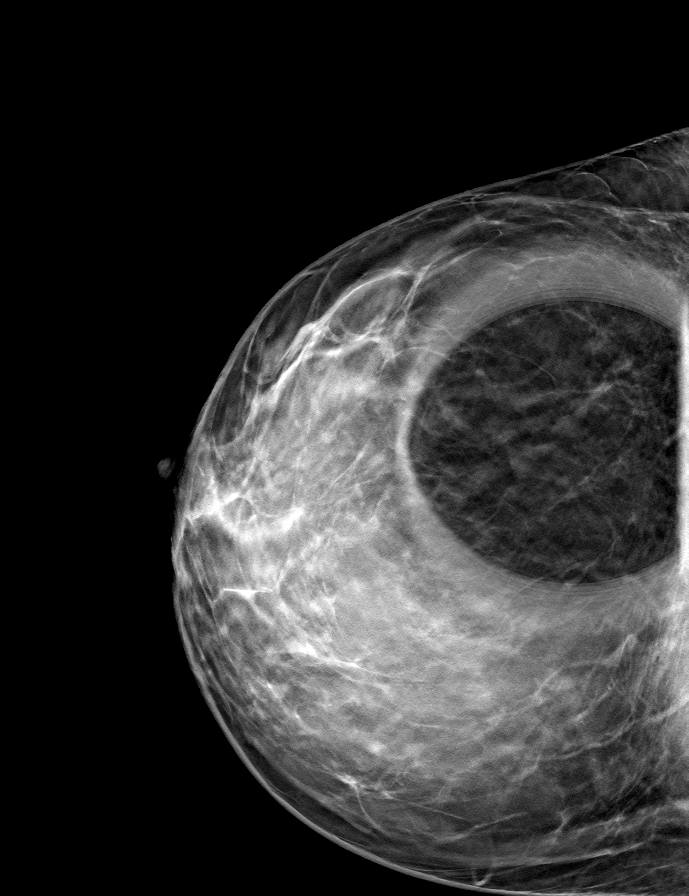
[frame 29/58]
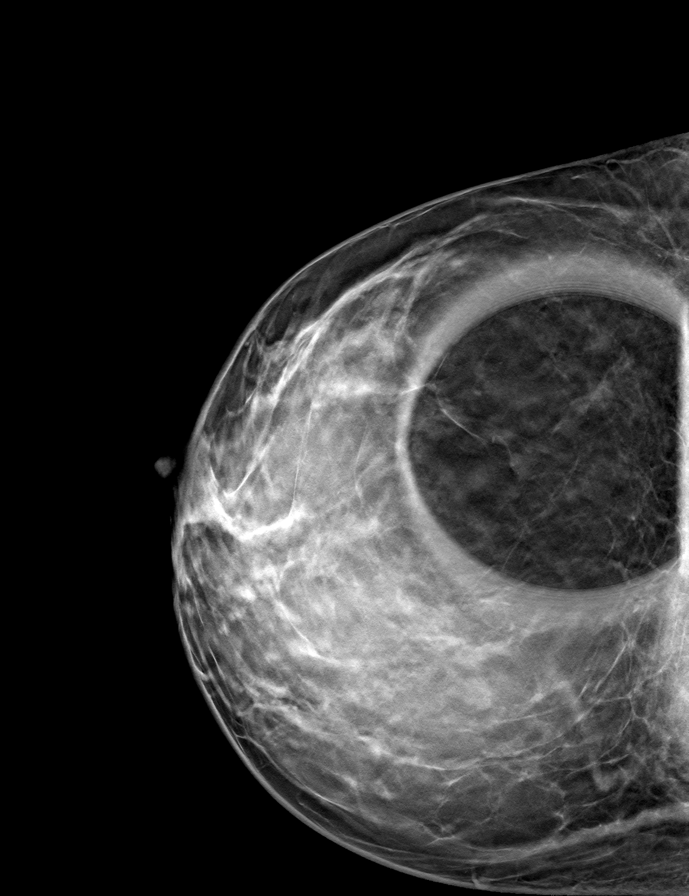
[frame 40/58]
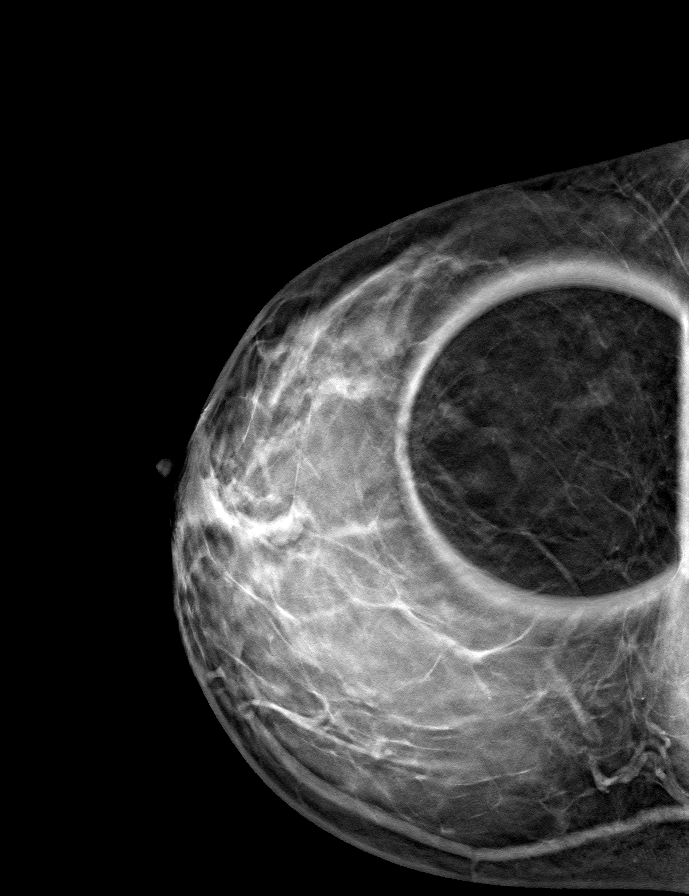

[R MLO tomo · tomo slice 33/64.0]
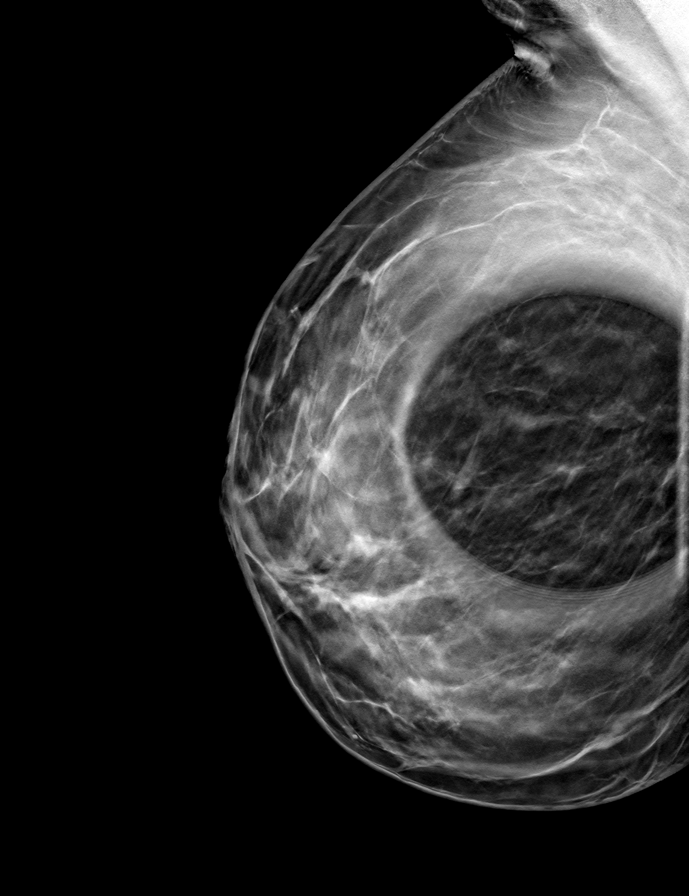

[9 of 15 positions shown; findings below may reference images not displayed]

ACR Breast Density Category b: There are scattered areas of
fibroglandular density.
FINDINGS: On the spot-compression images, the possible asymmetry noted on the
right breast screening MLO view disperses consistent with
superimposed fibroglandular tissue. There is no underlying mass or
significant residual asymmetry.

On the magnification images, the small group of calcifications in
the upper outer right breast project within the skin, benign.
IMPRESSION: 1. No evidence of breast malignancy.
2. Benign dermal calcifications over the upper outer right breast.

RECOMMENDATION:
Screening mammogram in one year.(Code:FL-[id number])

I have discussed the findings and recommendations with the patient.
If applicable, a reminder letter will be sent to the patient
regarding the next appointment.

BI-RADS CATEGORY  2: Benign.

## 2023-12-07 IMAGING — CT CT ABD-PELV W/ CM
2 of 5 series · 11 of 46 positions shown, 12 images · IV contrast (agent unspecified)
Comparison: None.

CLINICAL DATA: Pain for many years gassy diarrhea

EXAM:
CT ABDOMEN AND PELVIS WITH CONTRAST
TECHNIQUE: Multidetector CT imaging of the abdomen and pelvis was performed
using the standard protocol following bolus administration of
intravenous contrast.

[Series 2: abd pelvis 5.00 br40 s3 axial · axial · 0.58mm/px · z∈[+1249,+1624]mm · 8 of 97 slices shown, 9 images]
[im 11/97  soft-tissue]
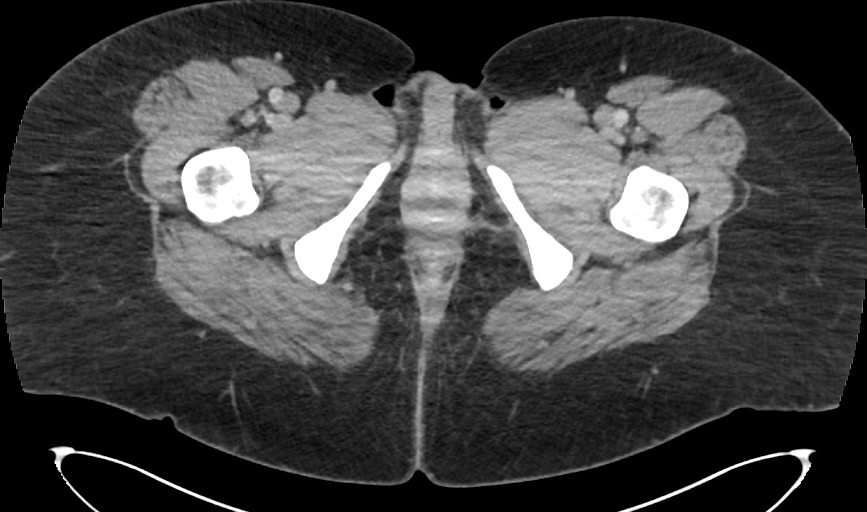
[im 11/97  bone]
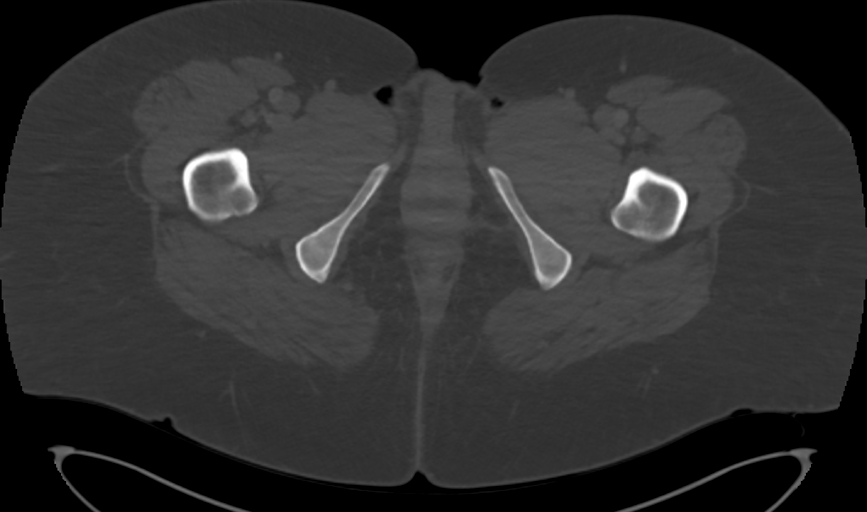
[im 21/97  soft-tissue]
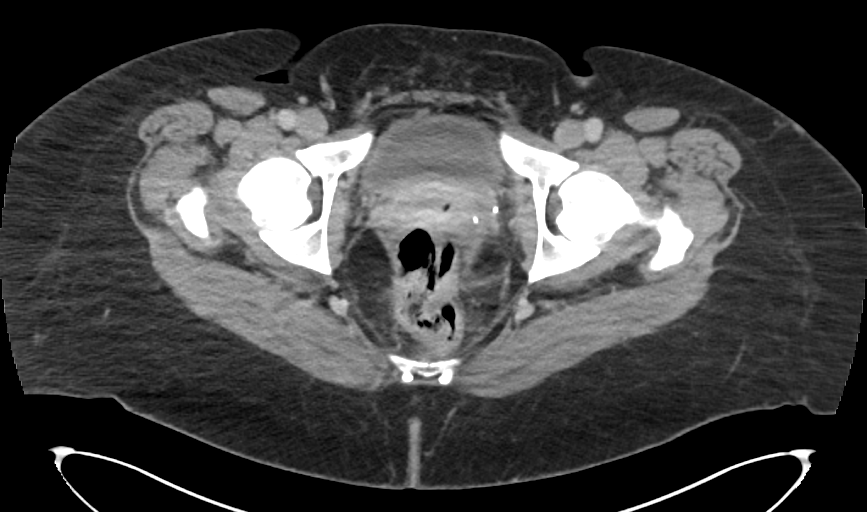
[im 31/97  soft-tissue]
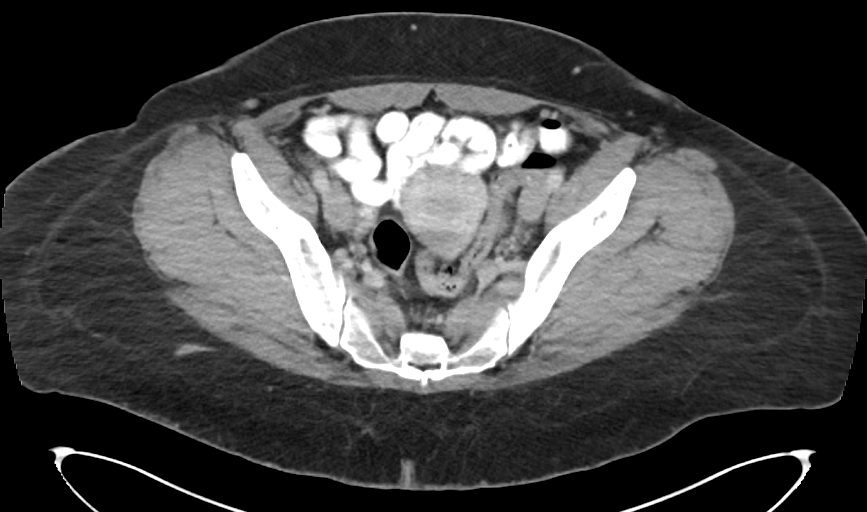
[im 41/97  soft-tissue]
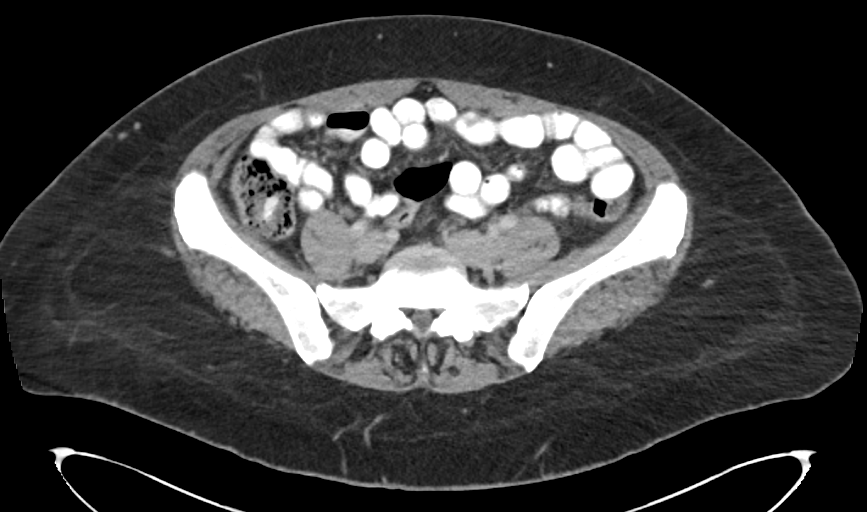
[im 56/97  soft-tissue]
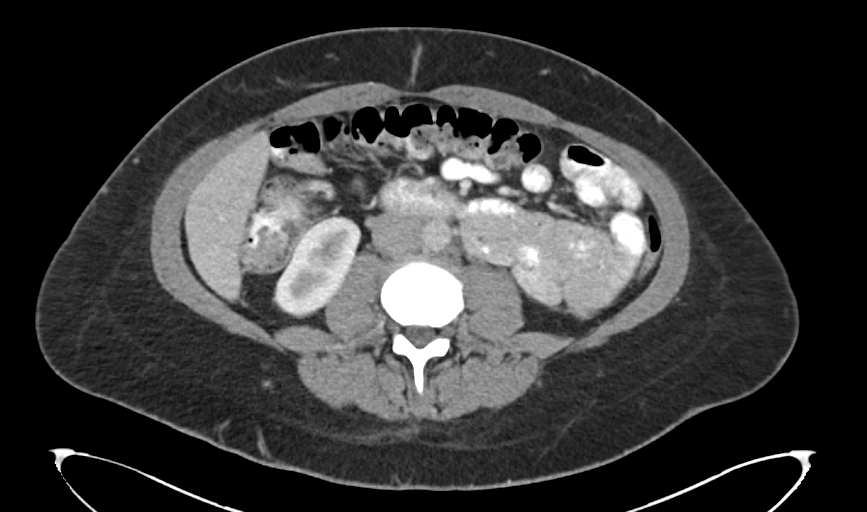
[im 66/97  soft-tissue]
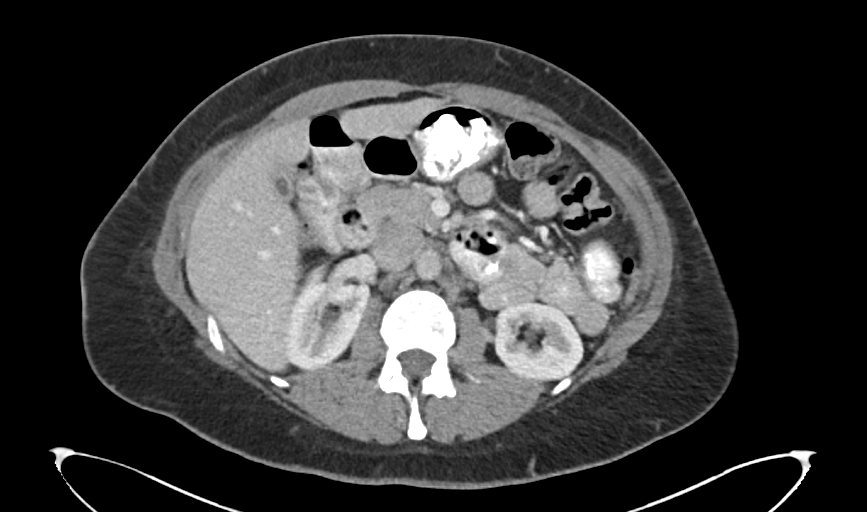
[im 76/97  soft-tissue]
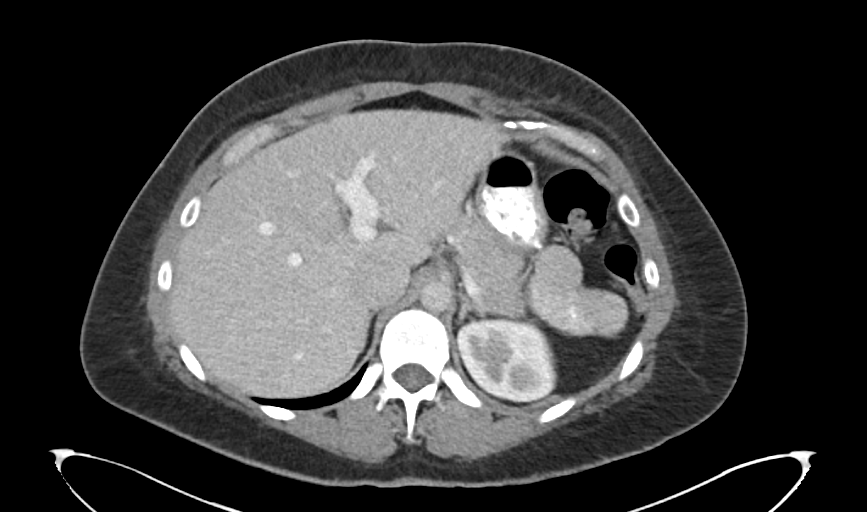
[im 86/97  soft-tissue]
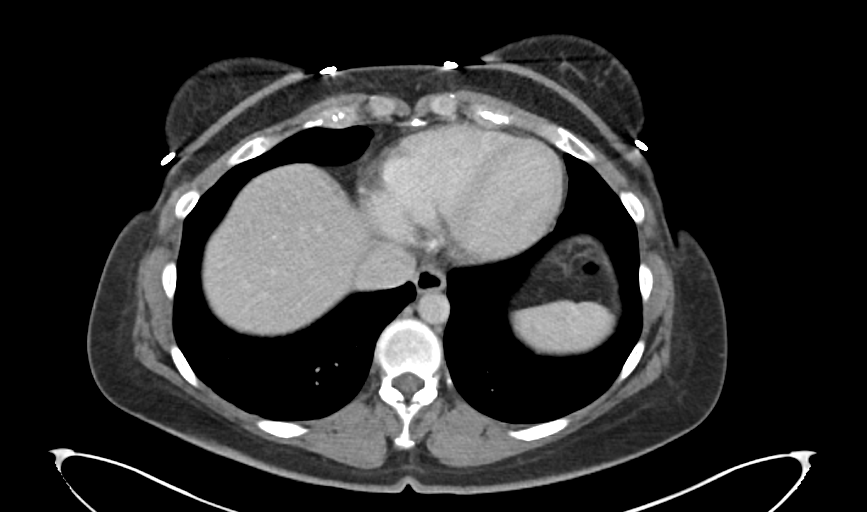

[Series 6: abd pelvis 2.00 br40 s3 cor · coronal · 0.95mm/px · 3 of 152 slices shown]
[im 51/152  soft-tissue]
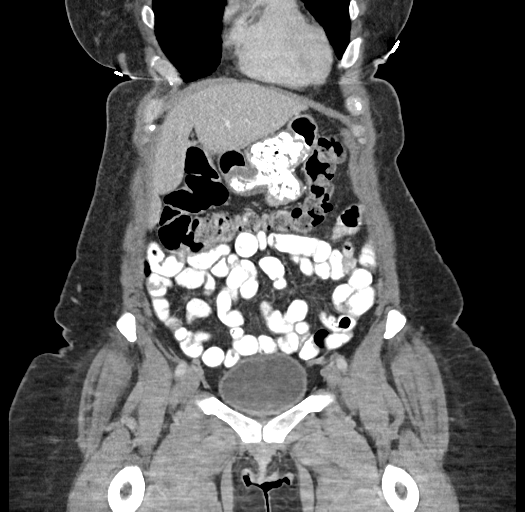
[im 68/152  soft-tissue]
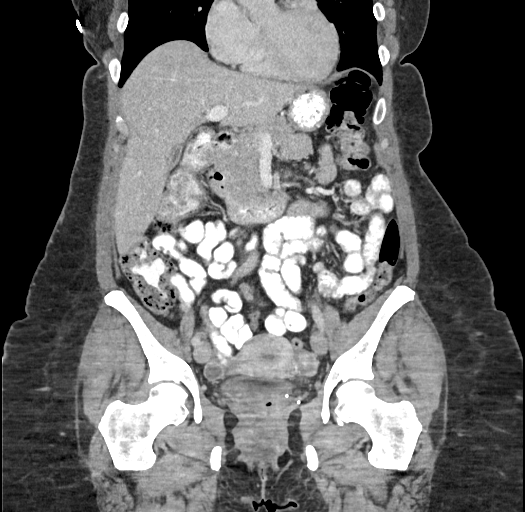
[im 84/152  soft-tissue]
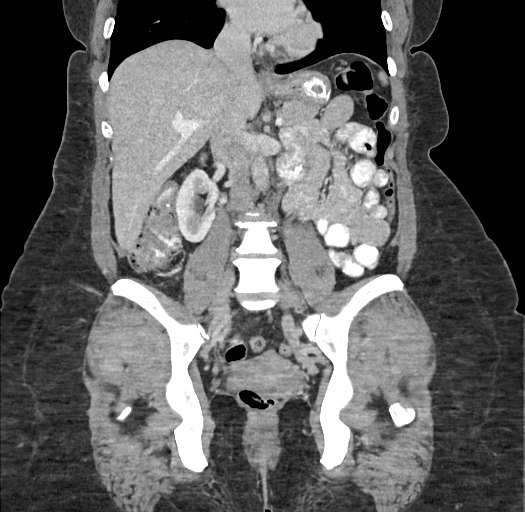

[11 of 46 positions shown; findings below may reference images not displayed]

RADIATION DOSE REDUCTION: This exam was performed according to the
departmental dose-optimization program which includes automated
exposure control, adjustment of the mA and/or kV according to
patient size and/or use of iterative reconstruction technique.

CONTRAST:  100mL [id number] IOPAMIDOL ([id number]) INJECTION 61%
FINDINGS: Lower chest: Lung bases demonstrate no acute consolidation or
effusion. Normal cardiac size.

Hepatobiliary: No focal liver abnormality is seen. No gallstones,
gallbladder wall thickening, or biliary dilatation.

Pancreas: Unremarkable. No pancreatic ductal dilatation or
surrounding inflammatory changes.

Spleen: Normal in size without focal abnormality.

Adrenals/Urinary Tract: Adrenal glands are unremarkable. Kidneys are
normal, without renal calculi, focal lesion, or hydronephrosis.
Bladder is unremarkable.

Stomach/Bowel: Status post gastric sleeve surgery. No dilated small
bowel. No acute bowel wall thickening. Negative appendix

Vascular/Lymphatic: No significant vascular findings are present. No
enlarged abdominal or pelvic lymph nodes.

Reproductive: Uterus and bilateral adnexa are unremarkable.

Other: Negative for pelvic effusion or free air

Musculoskeletal: No acute or significant osseous findings.
IMPRESSION: No CT evidence for acute intra-abdominal or pelvic abnormality.

## 2024-03-04 ENCOUNTER — Other Ambulatory Visit: Payer: Self-pay | Admitting: Medical Genetics
